# Patient Record
Sex: Male | Born: 1940 | Race: White | Hispanic: No | Marital: Married | Smoking: Never smoker
Health system: Southern US, Community
[De-identification: ages and names within clinical notes are randomized; demographics above are authoritative.]

## PROBLEM LIST (undated history)

## (undated) DIAGNOSIS — I2510 Atherosclerotic heart disease of native coronary artery without angina pectoris: Secondary | ICD-10-CM

## (undated) DIAGNOSIS — I10 Essential (primary) hypertension: Secondary | ICD-10-CM

## (undated) DIAGNOSIS — K219 Gastro-esophageal reflux disease without esophagitis: Secondary | ICD-10-CM

## (undated) DIAGNOSIS — N200 Calculus of kidney: Secondary | ICD-10-CM

## (undated) DIAGNOSIS — E7800 Pure hypercholesterolemia, unspecified: Secondary | ICD-10-CM

## (undated) DIAGNOSIS — R011 Cardiac murmur, unspecified: Secondary | ICD-10-CM

## (undated) HISTORY — PX: URETEROLITHOTOMY: SHX71

## (undated) HISTORY — PX: LITHOTRIPSY: SUR834

## (undated) HISTORY — PX: CATARACT EXTRACTION: SUR2

## (undated) HISTORY — PX: CORONARY ANGIOPLASTY WITH STENT PLACEMENT: SHX49

---

## 2010-11-04 ENCOUNTER — Emergency Department (HOSPITAL_COMMUNITY): Payer: Medicare Other

## 2010-11-04 ENCOUNTER — Emergency Department (HOSPITAL_COMMUNITY)
Admission: EM | Admit: 2010-11-04 | Discharge: 2010-11-04 | Disposition: A | Discharge status: Home / Self Care | Payer: Medicare Other | Attending: Emergency Medicine | Admitting: Emergency Medicine

## 2010-11-04 DIAGNOSIS — Z87442 Personal history of urinary calculi: Secondary | ICD-10-CM | POA: Insufficient documentation

## 2010-11-04 DIAGNOSIS — Z9861 Coronary angioplasty status: Secondary | ICD-10-CM | POA: Insufficient documentation

## 2010-11-04 DIAGNOSIS — I2510 Atherosclerotic heart disease of native coronary artery without angina pectoris: Secondary | ICD-10-CM | POA: Insufficient documentation

## 2010-11-04 DIAGNOSIS — I10 Essential (primary) hypertension: Secondary | ICD-10-CM | POA: Insufficient documentation

## 2010-11-04 DIAGNOSIS — R109 Unspecified abdominal pain: Secondary | ICD-10-CM | POA: Insufficient documentation

## 2010-11-04 DIAGNOSIS — R319 Hematuria, unspecified: Secondary | ICD-10-CM | POA: Insufficient documentation

## 2010-11-04 DIAGNOSIS — E7800 Pure hypercholesterolemia, unspecified: Secondary | ICD-10-CM | POA: Insufficient documentation

## 2010-11-04 HISTORY — DX: Pure hypercholesterolemia, unspecified: E78.00

## 2010-11-04 HISTORY — DX: Atherosclerotic heart disease of native coronary artery without angina pectoris: I25.10

## 2010-11-04 HISTORY — DX: Essential (primary) hypertension: I10

## 2010-11-04 HISTORY — DX: Calculus of kidney: N20.0

## 2010-11-04 LAB — BASIC METABOLIC PANEL
BUN: 15 mg/dL (ref 6–23)
CO2: 29 mEq/L (ref 19–32)
Calcium: 9.2 mg/dL (ref 8.4–10.5)
Creatinine, Ser: 0.92 mg/dL (ref 0.50–1.35)
Glucose, Bld: 98 mg/dL (ref 70–99)

## 2010-11-04 LAB — URINALYSIS, ROUTINE W REFLEX MICROSCOPIC
Bilirubin Urine: NEGATIVE
Ketones, ur: NEGATIVE mg/dL
Specific Gravity, Urine: 1.025 (ref 1.005–1.030)
Urobilinogen, UA: 0.2 mg/dL (ref 0.0–1.0)
pH: 8 (ref 5.0–8.0)

## 2010-11-04 LAB — CBC
HCT: 42 % (ref 39.0–52.0)
MCH: 31.2 pg (ref 26.0–34.0)
MCV: 90.9 fL (ref 78.0–100.0)
RBC: 4.62 MIL/uL (ref 4.22–5.81)
WBC: 7.4 10*3/uL (ref 4.0–10.5)

## 2010-11-04 LAB — DIFFERENTIAL
Eosinophils Absolute: 0 10*3/uL (ref 0.0–0.7)
Eosinophils Relative: 0 % (ref 0–5)
Lymphocytes Relative: 22 % (ref 12–46)
Lymphs Abs: 1.6 10*3/uL (ref 0.7–4.0)
Monocytes Absolute: 0.5 10*3/uL (ref 0.1–1.0)
Monocytes Relative: 7 % (ref 3–12)

## 2010-11-04 MED ORDER — HYDROCODONE-ACETAMINOPHEN 5-500 MG PO TABS
1.0000 | ORAL_TABLET | Freq: Four times a day (QID) | ORAL | Status: AC | PRN
Start: 2010-11-04 — End: 2010-12-15

## 2010-11-04 MED ORDER — CIPROFLOXACIN HCL 500 MG PO TABS: 500.0000 mg | ORAL_TABLET | Freq: Two times a day (BID) | ORAL | Status: AC

## 2010-11-04 NOTE — ED Provider Notes (Signed)
History   Scribed for Mario Pirro [name], MD, the patient was seen in room APFT22/APFT22. This chart was scribed by Clarita Crane. This patient's care was started at 3:52PM.  CSN: [id number] Arrival date & time: 11/04/2010  3:31 PM  Chief Complaint  Patient presents with  . Hematuria   HPI Mario Brooks is a 70 y.o. male who presents to the Emergency Department complaining of hematuria onset 5 hours ago and persistent since with associated mild nausea, urinary frequency and mild right flank pain. Patient describes right flank pain as dull and rates pain as a 1/10 at its worst. Patient also reports episode of dysuria several days ago which has not recurred since. Denies abdominal pain, fever, chills, chest pain, SOB. Patient states current flank pain is not similar to severity of pain experienced previously with a kidney stone. Patient with h/o CAD, hypertension, hyperlipidemia, kidney stones.   HPI ELEMENTS:  Onset: 5 hours ago Duration: persistent since onset  Timing: constant  Context:  as above  Associated symptoms: nausea, urinary frequency, right flank pain.  Denies abdominal pain, fever, chills, chest pain, SOB.  PAST MEDICAL HISTORY:  Past Medical History  Diagnosis Date  . Coronary artery disease   . Hypertension   . High cholesterol   . Kidney stones   . Kidney calculi     PAST SURGICAL HISTORY:  Past Surgical History  Procedure Date  . Coronary angioplasty with stent placement   . Cataract extraction   . Lithotripsy     MEDICATIONS:  Previous Medications   ATORVASTATIN (LIPITOR) 20 MG TABLET    Take 20 mg by mouth daily.     CO-ENZYME Q-10 30 MG CAPSULE    Take 30 mg by mouth daily.     DESLORATADINE (CLARINEX) 5 MG TABLET    Take 5 mg by mouth daily.     ESOMEPRAZOLE (NEXIUM) 40 MG CAPSULE    Take 40 mg by mouth daily before breakfast.     FISH OIL-OMEGA-3 FATTY ACIDS 1000 MG CAPSULE    Take 2 g by mouth daily.     FLUTICASONE (FLONASE) 50 MCG/ACT NASAL SPRAY     Place 2 sprays into the nose daily.     LISINOPRIL (PRINIVIL,ZESTRIL) 5 MG TABLET    Take 5 mg by mouth daily.     NAPHAZOLINE-PHENIRAMINE (VISINE-A OP)    Apply 2 drops to eye daily as needed.     OVER THE COUNTER MEDICATION    Take 1 tablet by mouth 2 (two) times daily. MDR: Multivitamin    TRETINOIN (RETIN-A) 0.025 % CREAM    Apply 1 application topically daily as needed.     VITAMIN E (VITAMIN E) 400 UNIT CAPSULE    Take 400 Units by mouth daily.       ALLERGIES:  Allergies as of 11/04/2010  . (No Known Allergies)     FAMILY HISTORY:  History reviewed. No pertinent family history.   SOCIAL HISTORY: History   Social History  . Marital Status: Married    Spouse Name: N/A    Number of Children: N/A  . Years of Education: N/A   Social History Main Topics  . Smoking status: Never Smoker   . Smokeless tobacco: None  . Alcohol Use: No  . Drug Use: No  . Sexually Active:    Other Topics Concern  . None   Social History Narrative  . None      Review of Systems 10 Systems reviewed and are negative  for acute change except as noted in the HPI.  Physical Exam  BP 144/87  Pulse 93  Temp(Src) 98.7 F (37.1 C) (Oral)  Resp 18  Ht 5\' 11"  (1.803 m)  Wt 185 lb (83.915 kg)  BMI 25.80 kg/m2  SpO2 99%  Physical Exam  Nursing note and vitals reviewed. Constitutional: He is oriented to person, place, and time. He appears well-developed and well-nourished. No distress.  HENT:  Head: Normocephalic and atraumatic.       Moist mucous membranes  Eyes: EOM are normal. Pupils are equal, round, and reactive to light.  Neck: Neck supple. No tracheal deviation present. No thyromegaly present.  Cardiovascular: Normal rate, regular rhythm and normal heart sounds.  Exam reveals no gallop and no friction rub.   No murmur heard. Pulmonary/Chest: Effort normal and breath sounds normal. He has no wheezes.  Abdominal: Soft. Bowel sounds are normal. He exhibits no distension. There is no  tenderness.  Musculoskeletal: Normal range of motion. He exhibits no edema.  Lymphadenopathy:    He has no cervical adenopathy.  Neurological: He is alert and oriented to person, place, and time. He has normal strength. No sensory deficit. GCS eye subscore is 4. GCS verbal subscore is 5. GCS motor subscore is 6.       L1 S5 intact. No sensory deficit.   Skin: Skin is warm and dry.  Psychiatric: He has a normal mood and affect. His behavior is normal.    ED Course  Procedures  OTHER DATA REVIEWED: Nursing notes, vital signs, and past medical records reviewed. Lab results reviewed and considered Imaging results reviewed and considered  DIAGNOSTIC STUDIES: Oxygen Saturation is 99% on room air, normal by my interpretation.    LABS / RADIOLOGY: Results for orders placed during the hospital encounter of 11/04/10  URINALYSIS, ROUTINE W REFLEX MICROSCOPIC      Component Value Range   Color, Urine BROWN (*) YELLOW    Appearance HAZY (*) CLEAR    Specific Gravity, Urine 1.025  1.005 - 1.030    pH 8.0  5.0 - 8.0    Glucose, UA NEGATIVE  NEGATIVE (mg/dL)   Hgb urine dipstick LARGE (*) NEGATIVE    Bilirubin Urine NEGATIVE  NEGATIVE    Ketones, ur NEGATIVE  NEGATIVE (mg/dL)   Protein, ur 30 (*) NEGATIVE (mg/dL)   Urobilinogen, UA 0.2  0.0 - 1.0 (mg/dL)   Nitrite NEGATIVE  NEGATIVE    Leukocytes, UA NEGATIVE  NEGATIVE   URINE MICROSCOPIC-ADD ON      Component Value Range   RBC / HPF TOO NUMEROUS TO COUNT  <3 (RBC/hpf)  CBC      Component Value Range   WBC 7.4  4.0 - 10.5 (K/uL)   RBC 4.62  4.22 - 5.81 (MIL/uL)   Hemoglobin 14.4  13.0 - 17.0 (g/dL)   HCT 16.1  09.6 - 04.5 (%)   MCV 90.9  78.0 - 100.0 (fL)   MCH 31.2  26.0 - 34.0 (pg)   MCHC 34.3  30.0 - 36.0 (g/dL)   RDW 40.9  81.1 - 91.4 (%)   Platelets 196  150 - 400 (K/uL)  DIFFERENTIAL      Component Value Range   Neutrophils Relative 71  43 - 77 (%)   Neutro Abs 5.3  1.7 - 7.7 (K/uL)   Lymphocytes Relative 22  12 - 46 (%)     Lymphs Abs 1.6  0.7 - 4.0 (K/uL)   Monocytes Relative 7  3 -  12 (%)   Monocytes Absolute 0.5  0.1 - 1.0 (K/uL)   Eosinophils Relative 0  0 - 5 (%)   Eosinophils Absolute 0.0  0.0 - 0.7 (K/uL)   Basophils Relative 0  0 - 1 (%)   Basophils Absolute 0.0  0.0 - 0.1 (K/uL)  BASIC METABOLIC PANEL      Component Value Range   Sodium 141  135 - 145 (mEq/L)   Potassium 3.9  3.5 - 5.1 (mEq/L)   Chloride 105  96 - 112 (mEq/L)   CO2 29  19 - 32 (mEq/L)   Glucose, Bld 98  70 - 99 (mg/dL)   BUN 15  6 - 23 (mg/dL)   Creatinine, Ser 1.61  0.50 - 1.35 (mg/dL)   Calcium 9.2  8.4 - 09.6 (mg/dL)   GFR calc non Af Amer >60  >60 (mL/min)   GFR calc Af Amer >60  >60 (mL/min)   Ct Abdomen Pelvis Wo Contrast  11/04/2010  *RADIOLOGY REPORT*  Clinical Data: Hematuria, nausea, urinary frequency, right flank pain, recent dysuria, history kidney stones, hypertension, coronary artery disease, hyperlipidemia  CT ABDOMEN AND PELVIS WITHOUT CONTRAST  Technique:  Multidetector CT imaging of the abdomen and pelvis was performed following the standard protocol without intravenous contrast. Sagittal and coronal MPR images reconstructed from axial data set.  Comparison: None  Findings: Lung bases clear. Large bladder calculus 1.8 x 1.6 x 1.1 cm. No additional urinary tract calcification or dilatation. Within limits of a nonenhanced exam, no focal abnormalities of the liver, spleen, pancreas, kidneys, or adrenal glands. Appendix not visualized, but no pericecal inflammatory process seen.  Numerous pelvic phleboliths. Dilatation of left inguinal ring and proximal inguinal canal by fat. Stomach and bowel loops grossly unremarkable for technique. No mass, adenopathy, free fluid, or inflammatory process. No acute osseous findings.  IMPRESSION: Large bladder calculus 1.8 x 1.6 x 1.1 cm. Otherwise negative noncontrast assessment of the urinary tract. Probable small left inguinal hernia. Nonvisualization of appendix, though no pericecal  inflammatory process identified.  Original Report Authenticated By: Lollie Marrow, M.D.    ED COURSE / COORDINATION OF CARE: Orders Placed This Encounter  Procedures  . Urine culture  . CT Abdomen Pelvis Wo Contrast  . Urinalysis with microscopic  . Urine microscopic-add on  . CBC  . Differential  . Basic metabolic panel  5:41PM- Patient informed of lab and imaging results. Explained probable cause of patient's symptoms and intent to d/c home. Also informed patient of referral to Dr. Jerre Simon in order to receive treatment for bladder stone. Patient agrees with plan set forth at this time.    MDM: Differential Diagnosis: Kidney stone cystitis   PLAN: review labs and CT scan The patient is to return the emergency department if there is any worsening of symptoms. I have reviewed the discharge instructions with the patient/family  CONDITION ON DISCHARGE: good   MEDICATIONS GIVEN IN THE E.D.  Medications  desloratadine (CLARINEX) 5 MG tablet (not administered)  esomeprazole (NEXIUM) 40 MG capsule (not administered)  atorvastatin (LIPITOR) 20 MG tablet (not administered)  lisinopril (PRINIVIL,ZESTRIL) 5 MG tablet (not administered)  tretinoin (RETIN-A) 0.025 % cream (not administered)  fluticasone (FLONASE) 50 MCG/ACT nasal spray (not administered)  Naphazoline-Pheniramine (VISINE-A OP) (not administered)  OVER THE COUNTER MEDICATION (not administered)  fish oil-omega-3 fatty acids 1000 MG capsule (not administered)  vitamin E (VITAMIN E) 400 UNIT capsule (not administered)  co-enzyme Q-10 30 MG capsule (not administered)      I  personally performed the services described in this documentation, which was scribed in my presence. The recorded information has been reviewed and considered. Cathrine Krizan [name], MD         Briggitte Boline [name], MD 11/04/10 [phone]

## 2010-11-04 NOTE — ED Notes (Signed)
Pt c/o blood in urine and burning upon urination

## 2010-11-05 LAB — URINE CULTURE

## 2010-11-09 ENCOUNTER — Ambulatory Visit (HOSPITAL_COMMUNITY)
Admission: RE | Admit: 2010-11-09 | Discharge: 2010-11-09 | Disposition: A | Discharge status: Home / Self Care | Payer: Medicare Other | Source: Ambulatory Visit | Attending: Urology | Admitting: Urology

## 2010-11-09 ENCOUNTER — Other Ambulatory Visit (HOSPITAL_COMMUNITY): Payer: Self-pay | Admitting: Urology

## 2010-11-09 DIAGNOSIS — N210 Calculus in bladder: Secondary | ICD-10-CM

## 2010-11-09 DIAGNOSIS — R109 Unspecified abdominal pain: Secondary | ICD-10-CM | POA: Insufficient documentation

## 2010-11-09 NOTE — Patient Instructions (Addendum)
20 Mario Brooks  11/09/2010   Your procedure is scheduled on:  11/15/2010  Report to [facility] at  800 AM.  Call this number if you have problems the morning of surgery: [phone]   Remember:   Do not eat food:After Midnight.  Do not drink clear liquids: After Midnight.  Take these medicines the morning of surgery with A SIP OF WATER: vicodin,clarinex,nexium,lisinopril,flomax   Do not wear jewelry, make-up or nail polish.  Do not wear lotions, powders, or perfumes. You may wear deodorant.  Do not shave 48 hours prior to surgery.  Do not bring valuables to the hospital.  Contacts, dentures or bridgework may not be worn into surgery.  Leave suitcase in the car. After surgery it may be brought to your room.  For patients admitted to the hospital, checkout time is 11:00 AM the day of discharge.   Patients discharged the day of surgery will not be allowed to drive home.  Name and phone number of your driver: family  Special Instructions: CHG Shower Use Special Wash: 1/2 bottle night before surgery and 1/2 bottle morning of surgery.   Please read over the following fact sheets that you were given: Pain Booklet, MRSA Information, Surgical Site Infection Prevention, Anesthesia Post-op Instructions and Care and Recovery After Surgery PATIENT INSTRUCTIONS POST-ANESTHESIA  IMMEDIATELY FOLLOWING SURGERY:  Do not drive or operate machinery for the first twenty four hours after surgery.  Do not make any important decisions for twenty four hours after surgery or while taking narcotic pain medications or sedatives.  If you develop intractable nausea and vomiting or a severe headache please notify your doctor immediately.  FOLLOW-UP:  Please make an appointment with your surgeon as instructed. You do not need to follow up with anesthesia unless specifically instructed to do so.  WOUND CARE INSTRUCTIONS (if applicable):  Keep a dry clean dressing on the anesthesia/puncture wound site if there is  drainage.  Once the wound has quit draining you may leave it open to air.  Generally you should leave the bandage intact for twenty four hours unless there is drainage.  If the epidural site drains for more than 36-48 hours please call the anesthesia department.  QUESTIONS?:  Please feel free to call your physician or the hospital operator if you have any questions, and they will be happy to assist you.     [facility] Anesthesia Department [address] [place] [phone]

## 2010-11-10 ENCOUNTER — Other Ambulatory Visit: Payer: Self-pay

## 2010-11-10 ENCOUNTER — Encounter (HOSPITAL_COMMUNITY): Payer: Self-pay

## 2010-11-10 ENCOUNTER — Encounter (HOSPITAL_COMMUNITY)
Admission: RE | Admit: 2010-11-10 | Discharge: 2010-11-10 | Disposition: A | Discharge status: Home / Self Care | Payer: Medicare Other | Source: Ambulatory Visit | Attending: Urology | Admitting: Urology

## 2010-11-10 HISTORY — DX: Gastro-esophageal reflux disease without esophagitis: K21.9

## 2010-11-10 HISTORY — DX: Cardiac murmur, unspecified: R01.1

## 2010-11-10 LAB — SURGICAL PCR SCREEN
MRSA, PCR: NEGATIVE
Staphylococcus aureus: NEGATIVE

## 2010-11-14 NOTE — H&P (Signed)
NAME:  URA, HAUSEN NO.:  [id number]  MEDICAL RECORD NO.:  [id number]  LOCATION:                                 FACILITY:  PHYSICIAN:  Dennie Maizes, M.D.   DATE OF BIRTH:  [birth year]  DATE OF ADMISSION: DATE OF DISCHARGE:  LH                             HISTORY & PHYSICAL   He is coming to the short-stay center on November 15, 2010.  CHIEF COMPLAINT:  Hematuria, suprapubic discomfort.  HISTORY OF PRESENT ILLNESS:  This 70 year old male has a history of recurrent urolithiasis in the past.  He has passed several stones since 1989.  He has undergone right ureteral lithotomy in [place] for right distal ureteral calculus.  He has passed 4 stones before.  While he was riding a lawnmower last week, he felt suprapubic discomfort and pain.  He noticed blood in the urine.  He went to the Emergency Room at [facility] for further evaluation on November 04, 2010. Microscopic hematuria was noted.  X-rays revealed large bladder calculus.  The patient was referred to me for further evaluation and management.  He has good urinary flow, urinary frequency x4, complains of nocturia x0 to 1.  AUA symptom score today is 6/35.  There is no past history of hematuria, urinary tract infections, or chronic prostatitis.  PAST MEDICAL HISTORY: 1. Recurrent urolithiasis status post right ureterolithotomy in 1989. 2. Coronary artery disease status post cardiac stent placement in     2010. 3. Status post right cataract extraction with lens implantation 2     months ago. 4. Elevated cholesterol. 5. GERD.  MEDICATIONS: 1. Lipitor 20 mg p.o. daily. 2. Nexium 40 mg p.o. daily. 3. Clarinex 5 mg p.o. daily. 4. Lisinopril 5 mg p.o. daily. 5. Flonase nasal spray.  ALLERGIES:  None.  REVIEW OF SYSTEMS:  Positive for hayfever, blurred vision, chest pain, high blood pressure, painful urination, and urinary frequency.  SOCIAL HISTORY:  The patient is married.  He is a  nonsmoker.  He denies use of alcohol.  PHYSICAL EXAMINATION:  VITAL SIGNS:  Height 5 feet and 11 inches, weight 185 pounds. HEAD, EYES, EARS, NOSE, AND THROAT:  Normal. LUNGS:  Clear to auscultation. HEART:  Regular rate and rhythm.  No murmurs. ABDOMEN:  Soft.  There is no palpable flank mass or CVA tenderness. Bladder is not distended. GU:  Penis and testes are normal. RECTAL:  Benign prostate.  The patient has undergone evaluation of a noncontrast CT scan of the abdomen pelvis at [facility].  CT scan revealed 1.8 x 1.6 x 1.1- cm size bladder calculus.  No upper tract urinary calculus or obstruction was noted.  X-ray of the KUB area revealed a 2.4 x 1.9-cm size bladder calculus.  IMPRESSION:  Large bladder calculus, hematuria, recurrent urolithiasis.  PLAN:  I have discussed with the patient regarding the management of the large bladder calculus.  He is scheduled to undergo a cystoscopy with holmium laser lithotripsy of the bladder calculus.  I explained to him regarding the diagnosis, operative details, alternative treatments, outcome, possible risks, and complications.  He has agreed for the procedure to be done.  He is advised to  stop taking aspirin for a week prior to the procedure.     Dennie Maizes, M.D.     SK/MEDQ  D:  11/14/2010  T:  11/14/2010  Job:  [id number]  cc:   Short-Stay Center at [facility]

## 2010-11-15 ENCOUNTER — Ambulatory Visit (HOSPITAL_COMMUNITY): Payer: Medicare Other | Admitting: Anesthesiology

## 2010-11-15 ENCOUNTER — Encounter (HOSPITAL_COMMUNITY): Payer: Self-pay | Admitting: Anesthesiology

## 2010-11-15 ENCOUNTER — Other Ambulatory Visit: Payer: Self-pay | Admitting: Urology

## 2010-11-15 ENCOUNTER — Encounter (HOSPITAL_COMMUNITY)
Admission: RE | Disposition: A | Discharge status: Home / Self Care | Payer: Self-pay | Source: Ambulatory Visit | Attending: Urology

## 2010-11-15 ENCOUNTER — Ambulatory Visit (HOSPITAL_COMMUNITY)
Admission: RE | Admit: 2010-11-15 | Discharge: 2010-11-15 | Disposition: A | Discharge status: Home / Self Care | Payer: Medicare Other | Source: Ambulatory Visit | Attending: Urology | Admitting: Urology

## 2010-11-15 DIAGNOSIS — R319 Hematuria, unspecified: Secondary | ICD-10-CM | POA: Insufficient documentation

## 2010-11-15 DIAGNOSIS — N210 Calculus in bladder: Secondary | ICD-10-CM | POA: Insufficient documentation

## 2010-11-15 DIAGNOSIS — Z01810 Encounter for preprocedural cardiovascular examination: Secondary | ICD-10-CM | POA: Insufficient documentation

## 2010-11-15 HISTORY — PX: CYSTOSCOPY: SHX5120

## 2010-11-15 SURGERY — CYSTOSCOPY
Anesthesia: Spinal | Wound class: Clean Contaminated

## 2010-11-15 MED ORDER — CIPROFLOXACIN IN D5W 400 MG/200ML IV SOLN: 400.0000 mg | INTRAVENOUS | Status: DC

## 2010-11-15 MED ORDER — PROPOFOL 10 MG/ML IV EMUL
INTRAVENOUS | Status: AC
  Filled 2010-11-15: qty 20

## 2010-11-15 MED ORDER — MIDAZOLAM HCL 2 MG/2ML IJ SOLN
1.0000 mg | INTRAMUSCULAR | Status: DC | PRN
  Administered 2010-11-15: 2 mg via INTRAVENOUS

## 2010-11-15 MED ORDER — LIDOCAINE HCL (PF) 1 % IJ SOLN
INTRAMUSCULAR | Status: AC
  Filled 2010-11-15: qty 5

## 2010-11-15 MED ORDER — ONDANSETRON HCL 4 MG/2ML IJ SOLN: 4.0000 mg | Freq: Once | INTRAMUSCULAR | Status: DC | PRN

## 2010-11-15 MED ORDER — LACTATED RINGERS IV SOLN: INTRAVENOUS | Status: DC

## 2010-11-15 MED ORDER — FENTANYL CITRATE 0.05 MG/ML IJ SOLN
INTRAMUSCULAR | Status: DC | PRN
  Administered 2010-11-15: 50 ug via INTRAVENOUS
  Administered 2010-11-15: 25 ug via INTRAVENOUS

## 2010-11-15 MED ORDER — CIPROFLOXACIN IN D5W 400 MG/200ML IV SOLN
INTRAVENOUS | Status: AC
  Administered 2010-11-15: 400 mg via INTRAVENOUS
  Filled 2010-11-15: qty 200

## 2010-11-15 MED ORDER — MIDAZOLAM HCL 2 MG/2ML IJ SOLN
INTRAMUSCULAR | Status: AC
  Filled 2010-11-15: qty 2

## 2010-11-15 MED ORDER — CIPROFLOXACIN HCL 500 MG PO TABS: 500.0000 mg | ORAL_TABLET | Freq: Two times a day (BID) | ORAL | Status: AC

## 2010-11-15 MED ORDER — STERILE WATER FOR IRRIGATION IR SOLN
Status: DC | PRN
  Administered 2010-11-15: 3000 mL

## 2010-11-15 MED ORDER — OXYCODONE-ACETAMINOPHEN 7.5-325 MG PO TABS: 1.0000 | ORAL_TABLET | ORAL | Status: AC | PRN

## 2010-11-15 MED ORDER — BUPIVACAINE IN DEXTROSE 0.75-8.25 % IT SOLN
INTRATHECAL | Status: AC
  Filled 2010-11-15: qty 2

## 2010-11-15 MED ORDER — FENTANYL CITRATE 0.05 MG/ML IJ SOLN: 25.0000 ug | INTRAMUSCULAR | Status: DC | PRN

## 2010-11-15 MED ORDER — FENTANYL CITRATE 0.05 MG/ML IJ SOLN
INTRAMUSCULAR | Status: DC | PRN
  Administered 2010-11-15: 25 ug via INTRATHECAL

## 2010-11-15 MED ORDER — FENTANYL CITRATE 0.05 MG/ML IJ SOLN
INTRAMUSCULAR | Status: AC
  Filled 2010-11-15: qty 2

## 2010-11-15 MED ORDER — PROPOFOL 10 MG/ML IV EMUL
INTRAVENOUS | Status: DC | PRN
  Administered 2010-11-15: 25 ug/kg/min via INTRAVENOUS

## 2010-11-15 MED ORDER — SODIUM CHLORIDE 0.9 % IR SOLN
Status: DC | PRN
  Administered 2010-11-15: 3000 mL

## 2010-11-15 MED ORDER — MIDAZOLAM HCL 5 MG/5ML IJ SOLN
INTRAMUSCULAR | Status: DC | PRN
  Administered 2010-11-15: 2 mg via INTRAVENOUS

## 2010-11-15 MED ORDER — BUPIVACAINE HCL 0.75 % IJ SOLN
INTRAMUSCULAR | Status: DC | PRN
  Administered 2010-11-15: 12 mg via INTRATHECAL

## 2010-11-15 MED ORDER — LACTATED RINGERS IV SOLN
INTRAVENOUS | Status: DC
  Administered 2010-11-15 (×2): via INTRAVENOUS

## 2010-11-15 MED ORDER — MIDAZOLAM HCL 2 MG/2ML IJ SOLN
INTRAMUSCULAR | Status: AC
  Administered 2010-11-15: 2 mg via INTRAVENOUS
  Filled 2010-11-15: qty 2

## 2010-11-15 SURGICAL SUPPLY — 22 items
BAG DRAIN URO TABLE W/ADPT NS (DRAPE) ×2 IMPLANT
BAG HAMPER (MISCELLANEOUS) ×2 IMPLANT
BAG URINE DRAINAGE (UROLOGICAL SUPPLIES) ×2 IMPLANT
CATH FOLEY 2WAY SLVR  5CC 18FR (CATHETERS) ×1
CATH FOLEY 2WAY SLVR 5CC 18FR (CATHETERS) ×1 IMPLANT
CLOTH BEACON ORANGE TIMEOUT ST (SAFETY) ×2 IMPLANT
GLOVE BIOGEL PI IND STRL 7.0 (GLOVE) ×2 IMPLANT
GLOVE BIOGEL PI INDICATOR 7.0 (GLOVE) ×2
GLOVE ECLIPSE 6.5 STRL STRAW (GLOVE) ×2 IMPLANT
GLOVE ECLIPSE 7.0 STRL STRAW (GLOVE) ×2 IMPLANT
GLOVE EXAM NITRILE MD LF STRL (GLOVE) ×2 IMPLANT
GOWN BRE IMP SLV AUR XL STRL (GOWN DISPOSABLE) ×6 IMPLANT
IV NS IRRIG 3000ML ARTHROMATIC (IV SOLUTION) ×4 IMPLANT
KIT ROOM TURNOVER AP CYSTO (KITS) ×2 IMPLANT
LASER FIBER DISP (UROLOGICAL SUPPLIES) IMPLANT
LASER FIBER DISP 1000U (UROLOGICAL SUPPLIES) ×2 IMPLANT
MANIFOLD NEPTUNE II (INSTRUMENTS) ×2 IMPLANT
PACK CYSTO (CUSTOM PROCEDURE TRAY) ×2 IMPLANT
PAD ARMBOARD 7.5X6 YLW CONV (MISCELLANEOUS) ×2 IMPLANT
SYRINGE IRR TOOMEY STRL 70CC (SYRINGE) IMPLANT
TOWEL OR 17X26 4PK STRL BLUE (TOWEL DISPOSABLE) ×2 IMPLANT
WATER STERILE IRR 1000ML POUR (IV SOLUTION) ×2 IMPLANT

## 2010-11-15 NOTE — Anesthesia Procedure Notes (Signed)
Spinal Block  Patient location during procedure: OR Start time: 11/15/2010 10:55 AM Staffing CRNA/Resident: Marylene Buerger Performed by: resident/CRNA  Preanesthetic Checklist Completed: patient identified, surgical consent, pre-op evaluation and monitors and equipment checked Spinal Block Patient position: right lateral decubitus Prep: Betadine Patient monitoring: heart rate, cardiac monitor, continuous pulse ox and blood pressure Approach: midline Location: L3-4 Injection technique: single-shot Needle Needle type: Spinocan  Needle gauge: 22 G Needle length: 9 cm Additional Notes [id number] 2012  10  mARCAINE 12 mG fENTANYL 25 mCG

## 2010-11-15 NOTE — Anesthesia Preprocedure Evaluation (Addendum)
Anesthesia Evaluation  Name, MR# and DOB Patient awake  General Assessment Comment  Reviewed: Allergy & Precautions, H&P , NPO status , Patient's Chart, lab work & pertinent test results  History of Anesthesia Complications Negative for: history of anesthetic complications  Airway Mallampati: III  Neck ROM: Full    Dental  (+) Teeth Intact   Pulmonary    pulmonary exam normalPulmonary Exam Normal     Cardiovascular hypertension, Pt. on medications - angina+ CAD Regular Normal    Neuro/Psych   GI/Hepatic/Renal   Endo/Other    Abdominal   Musculoskeletal   Hematology   Peds  Reproductive/Obstetrics    Anesthesia Other Findings             Anesthesia Physical Anesthesia Plan  ASA: III  Anesthesia Plan: Spinal   Post-op Pain Management:    Induction:   Airway Management Planned: Nasal Cannula  Additional Equipment:   Intra-op Plan:   Post-operative Plan:   Informed Consent: I have reviewed the patients History and Physical, chart, labs and discussed the procedure including the risks, benefits and alternatives for the proposed anesthesia with the patient or authorized representative who has indicated his/her understanding and acceptance.     Plan Discussed with: CRNA  Anesthesia Plan Comments: (Marcain SAB.)        Anesthesia Quick Evaluation

## 2010-11-15 NOTE — Preoperative (Signed)
Beta Blockers   Reason not to administer Beta Blockers:Not Applicable 

## 2010-11-15 NOTE — H&P (Signed)
Update H&P No changes in the H&P.

## 2010-11-15 NOTE — Transfer of Care (Signed)
Immediate Anesthesia Transfer of Care Note  Patient: Mario Brooks  Procedure(s) Performed:  CYSTOSCOPY - Cysto/Holmium Laser Lithotripsy Bladder Calculus-specimen given to patient per MD; HOLMIUM LASER APPLICATION  Patient Location: PACU  Anesthesia Type: Spinal  Level of Consciousness: awake, alert  and oriented  Airway & Oxygen Therapy: Patient Spontanous Breathing and Patient connected to face mask oxygen  Post-op Assessment: Report given to PACU RN, Post -op Vital signs reviewed and stable and Patient moving all extremities  Post vital signs: Reviewed and stable  Complications: No apparent anesthesia complications

## 2010-11-15 NOTE — Anesthesia Postprocedure Evaluation (Signed)
  Anesthesia Post-op Note  Patient: Mario Brooks  Procedure(s) Performed:  CYSTOSCOPY - Cysto/Holmium Laser Lithotripsy Bladder Calculus-specimen given to patient per MD; HOLMIUM LASER APPLICATION  Patient Location: PACU  Anesthesia Type: Spinal  Level of Consciousness: awake, alert  and oriented  Airway and Oxygen Therapy: Patient Spontanous Breathing  Post-op Pain: none  Post-op Assessment: Post-op Vital signs reviewed, Patient's Cardiovascular Status Stable and Respiratory Function Stable  Post-op Vital Signs: Reviewed and stable  Complications: No apparent anesthesia complications

## 2010-11-16 NOTE — Op Note (Signed)
NAMETORRIS, HOUSE               ACCOUNT NO.:  [id number]  MEDICAL RECORD NO.:  [id number]  LOCATION:  [facility]                          FACILITY:  [facility]  PHYSICIAN:  Dennie Maizes, M.D.   DATE OF BIRTH:  Jul 21, 1940  DATE OF PROCEDURE:  11/15/2010 DATE OF DISCHARGE:  11/15/2010                              OPERATIVE REPORT   PREOPERATIVE DIAGNOSES:  Hematuria, large bladder calculi size 2.5 cm.  POSTOPERATIVE DIAGNOSES:  Hematuria, large bladder calculi.size 2.5 cm.  OPERATIVE PROCEDURE:  Cystoscopy and holmium laser lithotripsy of large bladder calculus.  ANESTHESIA:  Spinal.  SURGEON:  Dennie Maizes, MD  COMPLICATIONS:  None.  ESTIMATED BLOOD LOSS:  Minimal.  DRAINS:  An 18-French Foley catheter in the bladder.  SPECIMEN:  Bladder stone fragments sent for chemical analysis.  COMPLICATIONS:  None.  INDICATIONS FOR PROCEDURE:  This 70 year old male was evaluated for intermittent gross hematuria.  His x-ray revealed a large bladder calculi measuring about 2.5 x 2 cm in size.  The patient was taken to operating room today for cystoscopy and holmium laser lithotripsy of the bladder calculus.  DESCRIPTION OF PROCEDURE:  Spinal anesthesia was induced, and the patient was placed on the table in the dorsal lithotomy position.  The lower abdomen and genitalia were prepped and draped in a sterile fashion.  Cystoscopy was done with a 25-French scope.  Urethra was normal.  There was moderate hypertrophy of the prostate with partial obstruction of bladder neck area.  The bladder was found to be trabeculated.  There was a large bladder stone about 2.5 cm in size inside the bladder lumen.  There is no other abnormality in the bladder.  Cystoscope was removed.  A 26-French Laserscope was inserted into the bladder.  The laser with a 1000 micron fiber was inserted into the bladder.  The stone was treated with holmium laser energy.  The procedure was started at 11:13 a.m. and ended  at 11:46 a.m. The pulse low at 10, high 20; power low 1, high 1.6, watts 32, pulses 20, total energy 13.56 kJ.  The stone fragmented well with the laser application. Stone fragments were then removed with an Solicitor.  There was no residual stone fragment in the bladder, and there was no injury to the bladder mucosa. There is no active bleeding at this time.  Instruments were removed.  An 18-French Foley catheter was inserted into the bladder and irrigation of bladder returned clear urine.  The patient was transferred to the PACU in a satisfactory condition.  Prior to transfer, an 18-French Foley catheter was inserted into the bladder.     Dennie Maizes, M.D.     SK/MEDQ  D:  11/15/2010  T:  11/15/2010  Job:  [id number]

## 2010-11-19 ENCOUNTER — Encounter (HOSPITAL_COMMUNITY): Payer: Self-pay | Admitting: Urology

## 2010-11-30 ENCOUNTER — Emergency Department (HOSPITAL_COMMUNITY): Payer: Medicare Other

## 2010-11-30 ENCOUNTER — Encounter (HOSPITAL_COMMUNITY): Payer: Self-pay

## 2010-11-30 ENCOUNTER — Inpatient Hospital Stay (HOSPITAL_COMMUNITY)
Admission: EM | Admit: 2010-11-30 | Discharge: 2010-12-03 | DRG: 373 | Disposition: A | Discharge status: Home / Self Care | Payer: Medicare Other | Attending: Internal Medicine | Admitting: Internal Medicine

## 2010-11-30 DIAGNOSIS — R197 Diarrhea, unspecified: Secondary | ICD-10-CM | POA: Diagnosis present

## 2010-11-30 DIAGNOSIS — I2510 Atherosclerotic heart disease of native coronary artery without angina pectoris: Secondary | ICD-10-CM | POA: Diagnosis present

## 2010-11-30 DIAGNOSIS — I10 Essential (primary) hypertension: Secondary | ICD-10-CM | POA: Diagnosis present

## 2010-11-30 DIAGNOSIS — D72829 Elevated white blood cell count, unspecified: Secondary | ICD-10-CM | POA: Diagnosis present

## 2010-11-30 DIAGNOSIS — R109 Unspecified abdominal pain: Secondary | ICD-10-CM | POA: Diagnosis present

## 2010-11-30 DIAGNOSIS — K529 Noninfective gastroenteritis and colitis, unspecified: Secondary | ICD-10-CM

## 2010-11-30 DIAGNOSIS — K219 Gastro-esophageal reflux disease without esophagitis: Secondary | ICD-10-CM | POA: Diagnosis present

## 2010-11-30 DIAGNOSIS — R112 Nausea with vomiting, unspecified: Secondary | ICD-10-CM | POA: Diagnosis present

## 2010-11-30 DIAGNOSIS — A0472 Enterocolitis due to Clostridium difficile, not specified as recurrent: Secondary | ICD-10-CM | POA: Diagnosis present

## 2010-11-30 DIAGNOSIS — E785 Hyperlipidemia, unspecified: Secondary | ICD-10-CM | POA: Diagnosis present

## 2010-11-30 DIAGNOSIS — Z9861 Coronary angioplasty status: Secondary | ICD-10-CM

## 2010-11-30 LAB — COMPREHENSIVE METABOLIC PANEL
AST: 26 U/L (ref 0–37)
Albumin: 3.6 g/dL (ref 3.5–5.2)
Alkaline Phosphatase: 95 U/L (ref 39–117)
Chloride: 104 mEq/L (ref 96–112)
Potassium: 3.8 mEq/L (ref 3.5–5.1)
Total Bilirubin: 1.2 mg/dL (ref 0.3–1.2)
Total Protein: 6.7 g/dL (ref 6.0–8.3)

## 2010-11-30 LAB — DIFFERENTIAL
Basophils Absolute: 0 10*3/uL (ref 0.0–0.1)
Basophils Relative: 0 % (ref 0–1)
Eosinophils Absolute: 0 10*3/uL (ref 0.0–0.7)
Neutro Abs: 18.1 10*3/uL — ABNORMAL HIGH (ref 1.7–7.7)
Neutrophils Relative %: 87 % — ABNORMAL HIGH (ref 43–77)

## 2010-11-30 LAB — CBC
MCHC: 34 g/dL (ref 30.0–36.0)
Platelets: 195 10*3/uL (ref 150–400)
RDW: 12.9 % (ref 11.5–15.5)

## 2010-11-30 LAB — URINALYSIS, ROUTINE W REFLEX MICROSCOPIC
Glucose, UA: NEGATIVE mg/dL
Ketones, ur: >80 mg/dL — AB
Leukocytes, UA: NEGATIVE
Specific Gravity, Urine: >1.03 — ABNORMAL HIGH (ref 1.005–1.030)
pH: 5.5 (ref 5.0–8.0)

## 2010-11-30 LAB — URINE MICROSCOPIC-ADD ON

## 2010-11-30 MED ORDER — ACETAMINOPHEN 500 MG PO TABS
1000.0000 mg | ORAL_TABLET | Freq: Once | ORAL | Status: AC
  Administered 2010-11-30: 1000 mg via ORAL
  Filled 2010-11-30: qty 2

## 2010-11-30 MED ORDER — ONDANSETRON HCL 4 MG/2ML IJ SOLN
4.0000 mg | Freq: Once | INTRAMUSCULAR | Status: AC
  Administered 2010-11-30: 4 mg via INTRAVENOUS
  Filled 2010-11-30: qty 2

## 2010-11-30 MED ORDER — ONDANSETRON HCL 4 MG/2ML IJ SOLN
INTRAMUSCULAR | Status: AC
  Administered 2010-11-30: 4 mg via INTRAVENOUS
  Filled 2010-11-30: qty 2

## 2010-11-30 MED ORDER — IOHEXOL 300 MG/ML  SOLN
100.0000 mL | Freq: Once | INTRAMUSCULAR | Status: AC | PRN
  Administered 2010-11-30: 100 mL via INTRAVENOUS

## 2010-11-30 MED ORDER — METRONIDAZOLE IN NACL 5-0.79 MG/ML-% IV SOLN
500.0000 mg | Freq: Once | INTRAVENOUS | Status: AC
  Administered 2010-11-30: 500 mg via INTRAVENOUS
  Filled 2010-11-30: qty 100

## 2010-11-30 MED ORDER — SODIUM CHLORIDE 0.9 % IV SOLN
Freq: Once | INTRAVENOUS | Status: AC
  Administered 2010-11-30: 16:00:00 via INTRAVENOUS

## 2010-11-30 NOTE — ED Notes (Addendum)
Pt reports improvement in nausea, although does report some continued pain.  Pt's largest complaint at present time is a headache.  Pt's also has mild increase in temperature. EDP at bedside.

## 2010-11-30 NOTE — H&P (Signed)
Mario Brooks is an 70 y.o. male.   Chief Complaint: Not feeling well HPI: 70 year old male with H/O cad S/P angioplast in 2010, hyperlipdemia and HTN presents with nusea vomiting and diarrhea over the last 24 hrs with subjective feeling of fever and chills. Patient had recent cystoscopy with stone extraction and was on antibiotic two weeks ago followed by anothr course of antibiotics two days ago for sinusitis. In the ER patient was found to have diffuse colitis in CT abdomen and pelvis with marked leucocytosis. Patient is admitted for colitis probably from C Diff.  Past Medical History  Diagnosis Date  . Coronary artery disease   . Hypertension   . High cholesterol   . Kidney stones   . Kidney calculi   . Heart murmur   . GERD (gastroesophageal reflux disease)     Past Surgical History  Procedure Date  . Coronary angioplasty with stent placement   . Cataract extraction     right eye-dUKE  . Lithotripsy     1984  . Ureterolithotomy     OPEN REMOVAL OF KIDNEY STONE RIGHT KIDNEY  . Cystoscopy 11/15/2010    Procedure: CYSTOSCOPY;  Surgeon: Dennie Maizes;  Location: [facility];  Service: Urology;  Laterality: N/A;  Cysto/Holmium Laser Lithotripsy Bladder Calculus-specimen given to patient per MD    Family History  Problem Relation Age of Onset  . Anesthesia problems Neg Hx   . Hypotension Neg Hx   . Malignant hyperthermia Neg Hx   . Pseudochol deficiency Neg Hx    Social History:  reports that he has never smoked. He does not have any smokeless tobacco history on file. He reports that he does not drink alcohol or use illicit drugs.  Allergies:  Allergies  Allergen Reactions  . Codeine     Medications Prior to Admission  Medication Dose Route Frequency Provider Last Rate Last Dose  . 0.9 %  sodium chloride infusion   Intravenous Once Benny Lennert, MD 75 mL/hr at 11/30/10 1546    . acetaminophen (TYLENOL) tablet 1,000 mg  1,000 mg Oral Once Benny Lennert, MD   1,000 mg at  11/30/10 1957  . iohexol (OMNIPAQUE) 300 MG/ML injection 100 mL  100 mL Intravenous Once PRN Medication Radiologist   100 mL at 11/30/10 1811  . metroNIDAZOLE (FLAGYL) IVPB 500 mg  500 mg Intravenous Once Benny Lennert, MD 100 mL/hr at 11/30/10 2045 500 mg at 11/30/10 2045  . ondansetron (ZOFRAN) 4 MG/2ML injection        4 mg at 11/30/10 1758  . ondansetron (ZOFRAN) injection 4 mg  4 mg Intravenous Once Benny Lennert, MD   4 mg at 11/30/10 1546   Medications Prior to Admission  Medication Sig Dispense Refill  . atorvastatin (LIPITOR) 20 MG tablet Take 20 mg by mouth at bedtime.       Marland Kitchen co-enzyme Q-10 30 MG capsule Take 30 mg by mouth every morning.       . desloratadine (CLARINEX) 5 MG tablet Take 5 mg by mouth at bedtime.       Marland Kitchen esomeprazole (NEXIUM) 40 MG capsule Take 40 mg by mouth daily before breakfast.        . fish oil-omega-3 fatty acids 1000 MG capsule Take 1 g by mouth at bedtime.       . fluticasone (FLONASE) 50 MCG/ACT nasal spray Place 2 sprays into the nose daily.        Marland Kitchen lisinopril (PRINIVIL,ZESTRIL)  5 MG tablet Take 5 mg by mouth at bedtime.       . tretinoin (RETIN-A) 0.025 % cream Apply 1 application topically daily as needed. For rosacea and blemishes      . vitamin E (VITAMIN E) 400 UNIT capsule Take 400 Units by mouth daily.        Marland Kitchen HYDROcodone-acetaminophen (VICODIN) 5-500 MG per tablet Take 1 tablet by mouth every 6 (six) hours as needed for pain.  10 tablet  0  . Naphazoline-Pheniramine (VISINE-A OP) Apply 2 drops to eye daily as needed. For itchy, tired eye      . OVER THE COUNTER MEDICATION Take 1 tablet by mouth 2 (two) times daily. MDR: Multivitamin       . oxyCODONE-acetaminophen (PERCOCET) 7.5-325 MG per tablet Take 1 tablet by mouth every 4 (four) hours as needed for pain.  20 tablet  0    Results for orders placed during the hospital encounter of 11/30/10 (from the past 48 hour(s))  URINALYSIS, ROUTINE W REFLEX MICROSCOPIC     Status: Abnormal    Collection Time   11/30/10  1:45 PM      Component Value Range Comment   Color, Urine YELLOW  YELLOW     Appearance CLEAR  CLEAR     Specific Gravity, Urine >1.030 (*) 1.005 - 1.030     pH 5.5  5.0 - 8.0     Glucose, UA NEGATIVE  NEGATIVE (mg/dL)    Hgb urine dipstick TRACE (*) NEGATIVE     Bilirubin Urine SMALL (*) NEGATIVE     Ketones, ur >80 (*) NEGATIVE (mg/dL)    Protein, ur 30 (*) NEGATIVE (mg/dL)    Urobilinogen, UA 0.2  0.0 - 1.0 (mg/dL)    Nitrite NEGATIVE  NEGATIVE     Leukocytes, UA NEGATIVE  NEGATIVE    URINE MICROSCOPIC-ADD ON     Status: Normal   Collection Time   11/30/10  1:45 PM      Component Value Range Comment   Squamous Epithelial / LPF RARE  RARE     WBC, UA 0-2  <3 (WBC/hpf)    RBC / HPF 0-2  <3 (RBC/hpf)    Urine-Other MUCOUS PRESENT     CBC     Status: Abnormal   Collection Time   11/30/10  3:47 PM      Component Value Range Comment   WBC 20.9 (*) 4.0 - 10.5 (K/uL)    RBC 4.74  4.22 - 5.81 (MIL/uL)    Hemoglobin 14.6  13.0 - 17.0 (g/dL)    HCT 04.5  40.9 - 81.1 (%)    MCV 90.7  78.0 - 100.0 (fL)    MCH 30.8  26.0 - 34.0 (pg)    MCHC 34.0  30.0 - 36.0 (g/dL)    RDW 91.4  78.2 - 95.6 (%)    Platelets 195  150 - 400 (K/uL)   DIFFERENTIAL     Status: Abnormal   Collection Time   11/30/10  3:47 PM      Component Value Range Comment   Neutrophils Relative 87 (*) 43 - 77 (%)    Neutro Abs 18.1 (*) 1.7 - 7.7 (K/uL)    Lymphocytes Relative 4 (*) 12 - 46 (%)    Lymphs Abs 0.8  0.7 - 4.0 (K/uL)    Monocytes Relative 10  3 - 12 (%)    Monocytes Absolute 2.0 (*) 0.1 - 1.0 (K/uL)    Eosinophils Relative 0  0 -  5 (%)    Eosinophils Absolute 0.0  0.0 - 0.7 (K/uL)    Basophils Relative 0  0 - 1 (%)    Basophils Absolute 0.0  0.0 - 0.1 (K/uL)   COMPREHENSIVE METABOLIC PANEL     Status: Abnormal   Collection Time   11/30/10  3:47 PM      Component Value Range Comment   Sodium 138  135 - 145 (mEq/L)    Potassium 3.8  3.5 - 5.1 (mEq/L)    Chloride 104  96 - 112  (mEq/L)    CO2 24  19 - 32 (mEq/L)    Glucose, Bld 116 (*) 70 - 99 (mg/dL)    BUN 18  6 - 23 (mg/dL)    Creatinine, Ser 1.61  0.50 - 1.35 (mg/dL)    Calcium 9.3  8.4 - 10.5 (mg/dL)    Total Protein 6.7  6.0 - 8.3 (g/dL)    Albumin 3.6  3.5 - 5.2 (g/dL)    AST 26  0 - 37 (U/L)    ALT 30  0 - 53 (U/L)    Alkaline Phosphatase 95  39 - 117 (U/L)    Total Bilirubin 1.2  0.3 - 1.2 (mg/dL)    GFR calc non Af Amer >60  >60 (mL/min)    GFR calc Af Amer >60  >60 (mL/min)    Ct Abdomen Pelvis W Contrast  11/30/2010  *RADIOLOGY REPORT*  Clinical Data: Fever, nausea, vomiting, and diarrhea  CT ABDOMEN AND PELVIS WITH CONTRAST  Technique:  Multidetector CT imaging of the abdomen and pelvis was performed following the standard protocol during bolus administration of intravenous contrast.  Contrast: OMNIPAQUE IOHEXOL 300 MG/ML IV SOLN  Comparison: CT stone study 11/04/2010  Findings: Visualized lung bases are clear.  Visualized heart is within normal limits for size.  The liver, gallbladder, spleen, pancreas, and adrenal glands are within normal limits.  No stone is seen within either kidney on contrast enhanced imaging. There is one low density lesion in the upper pole of the left kidney that measures 7 mm and is consistent with a simple cyst. Approximately 1 cm simple cyst is present in the right kidney.  Two additional tiny low density lesions of right kidney not completely characterized, but likely cysts.  There is no hydronephrosis.  Both ureters are normal in caliber.  No ureteral stone is seen.  There is no bladder stone.  Bladder appears within normal limits for degree of distention, not currently very distended.  There are calcifications within the normal-sized prostate gland.  The colonic wall is mildly circumferentially thickened from the level of the cecum to the rectum.  Wall thickness appears increased compared to prior study; for example,  wall thickening of the cecum is best on image number 62,  with adjacent mesenteric stranding. Wall thickening at the level the hepatic flexure is seen best on image number 35, and sigmoid colon wall thickening is appreciated on image number 73, among others.  No evidence of bowel obstruction. A definite appendix is not seen.  Terminal ileum is unremarkable.  The remainder of the small bowel loops are normal in caliber and wall thickness.  The stomach is within normal limits.  Abdominal aorta is normal in caliber.  Preaortic retroperitoneal lymph node measuring 6 mm is stable.  No pathologically enlarged lymph nodes in the abdomen or pelvis.  There is no ascites, free air, or abscess. Vertebral bodies are normal in height and alignment.  No acute or  suspicious bony abnormality.  IMPRESSION:  1.  Mild circumferential wall thickening of the majority of the colon suggests colitis.  This could be secondary to infection or inflammatory bowel disease.  There is some stranding adjacent to the cecum.  No evidence of abscess or obstruction.  2.  No urinary tract stone disease is seen on this contrast- enhanced study.  Negative for urinary tract obstruction.  Original Report Authenticated By: Britta Mccreedy, M.D.   Dg Abd Acute W/chest  11/30/2010  *RADIOLOGY REPORT*  Clinical Data: Diarrhea.  ACUTE ABDOMEN SERIES (ABDOMEN 2 VIEW & CHEST 1 VIEW)  Comparison: 11/09/2010  Findings: Heart and mediastinal contours are within normal limits. No focal opacities or effusions.  No acute bony abnormality.  Moderate stool throughout the colon.  Calcified phleboliths in the anatomic pelvis.  No obstruction or free air.  No organomegaly.  No acute bony abnormality.  IMPRESSION: No obstruction or free air.  No active cardiopulmonary disease.  Original Report Authenticated By: Cyndie Chime, M.D.    Review of Systems  Constitutional: Positive for fever and chills.  Respiratory: Negative for cough and shortness of breath.   Cardiovascular: Negative for chest pain and palpitations.    Gastrointestinal: Positive for nausea, vomiting, abdominal pain and diarrhea.  Genitourinary: Negative for dysuria and urgency.  Neurological: Negative for dizziness, loss of consciousness and headaches.  Psychiatric/Behavioral: Negative for depression.    Blood pressure 115/80, pulse 97, temperature 99.1 F (37.3 C), temperature source Oral, resp. rate 20, height 5\' 11"  (1.803 m), weight 83.915 kg (185 lb), SpO2 98.00%. Physical Exam  Constitutional: He is oriented to person, place, and time. He appears well-developed and well-nourished.  HENT:  Head: Normocephalic and atraumatic.  Eyes: Pupils are equal, round, and reactive to light.  Neck: Normal range of motion. Neck supple.  Cardiovascular: Normal rate and regular rhythm.   Respiratory: Effort normal and breath sounds normal.  GI: Soft. Bowel sounds are normal. There is no tenderness. There is no rebound and no guarding.  Musculoskeletal: Normal range of motion.  Neurological: He is alert and oriented to person, place, and time. He has normal reflexes.  Skin: Skin is warm and dry.  Psychiatric: His behavior is normal.     Assessment/Plan 1) Colitis probably C Diff  2)Nausea and vomting and diarrhea from #1 reason 3)H/O CAD S/P angioplasty stent presently chest pain free. 4)H/O Hyperlipidemia. 5)HTN 6)Leucocytosis.  Plan: Will admit to [facility] Patients colitis and nausea and vomiting and diarrhea and marked leucocytosis is probably from C diff colitis given the recent use of antibiotics and CT showing diffuse colitis.Will get cultures keep patient on clear liquids and flagyl stool studeis and hydrate. Recheck labs in AM. Continue present home medications.   Eduard Clos. 11/30/2010, 10:09 PM

## 2010-11-30 NOTE — ED Notes (Signed)
Pt c/o abd pain nausea and diarrhea. Pt states he has had abd pain for a long time and feels like it has been made worse by recent changes in medication.

## 2010-11-30 NOTE — ED Notes (Signed)
Pt states he had laser surgery for kidney stone removal 3 weeks ago. Two days ago he started with abd pain/flank pain and fever. C/o n.v as well.

## 2010-11-30 NOTE — ED Provider Notes (Addendum)
History    Scribed for Benny Lennert, MD, the patient was seen in room [facility]12[facility]12. This chart was scribed by Katha Cabal. This patient's care was started at 3:20 PM.   CSN: [id number] Arrival date & time: 11/30/2010  2:57 PM  Chief Complaint  Patient presents with  . Fever  . Flank Pain  . Abdominal Pain    HPI  (Consider location/radiation/quality/duration/timing/severity/associated sxs/prior treatment)  HPI  Mario Brooks is a 70 y.o. male who presents to the Emergency Department complaining of bilateral flank pain and suprapubic abdominal pain with associated fever (101.6 F), diaphoresis, and N/V/D today.  Fever began 3 days ago with sore throat, nasal congestion and rhinorrhea.  Patient saw PCP yesterday and was told he had a sinus infection.  Patient took 2 Zithromax pills yesterday but none today.  Denies current sinus infection sx.  Patient states that he began having fever, nausea and vomiting at 6 AM, and diarrhea began at 11:30 AM.  N/V/D has resolved.  Bilateral flank pain persists.  Per wife:  Patient had "quarter sized" kidney stone crushed by laser 3 weeks ago.   PCP Maximiano Coss, MD, MD Dr. Patrici Ranks   PAST MEDICAL HISTORY:  Past Medical History  Diagnosis Date  . Coronary artery disease   . Hypertension   . High cholesterol   . Kidney stones   . Kidney calculi   . Heart murmur   . GERD (gastroesophageal reflux disease)     PAST SURGICAL HISTORY:  Past Surgical History  Procedure Date  . Coronary angioplasty with stent placement   . Cataract extraction     right eye-dUKE  . Lithotripsy     1984  . Ureterolithotomy     OPEN REMOVAL OF KIDNEY STONE RIGHT KIDNEY  . Cystoscopy 11/15/2010    Procedure: CYSTOSCOPY;  Surgeon: Dennie Maizes;  Location: [facility];  Service: Urology;  Laterality: N/A;  Cysto/Holmium Laser Lithotripsy Bladder Calculus-specimen given to patient per MD    FAMILY HISTORY:  Family History  Problem Relation Age of  Onset  . Anesthesia problems Neg Hx   . Hypotension Neg Hx   . Malignant hyperthermia Neg Hx   . Pseudochol deficiency Neg Hx      SOCIAL HISTORY: History   Social History  . Marital Status: Married    Spouse Name: N/A    Number of Children: N/A  . Years of Education: N/A   Social History Main Topics  . Smoking status: Never Smoker   . Smokeless tobacco: None  . Alcohol Use: No     OCCASSIONAL WINE  . Drug Use: No  . Sexually Active: Yes   Other Topics Concern  . None   Social History Narrative  . None      Review of Systems  Review of Systems  Constitutional: Positive for fever and chills. Negative for fatigue.  HENT: Negative for congestion, sore throat, rhinorrhea, sinus pressure and ear discharge.   Eyes: Negative for discharge.  Respiratory: Negative for cough.   Cardiovascular: Negative for chest pain.  Gastrointestinal: Positive for nausea, vomiting, abdominal pain and diarrhea.  Genitourinary: Positive for flank pain. Negative for frequency and hematuria.  Musculoskeletal: Negative for back pain.  Skin: Negative for rash.  Neurological: Negative for seizures and headaches.  Hematological: Negative.   Psychiatric/Behavioral: Negative for hallucinations.    Allergies  Codeine  Home Medications   Current Outpatient Rx  Name Route Sig Dispense Refill  . ACETAMINOPHEN 500 MG PO TABS Oral Take  500 mg by mouth as needed. For pain     . ASPIRIN EC 81 MG PO TBEC Oral Take 81 mg by mouth at bedtime.      . ATORVASTATIN CALCIUM 20 MG PO TABS Oral Take 20 mg by mouth at bedtime.     Marland Kitchen HYDROCOD POLST-CHLORPHEN POLST 10-8 MG/5ML PO LQCR Oral Take 5 mLs by mouth every 12 (twelve) hours as needed. For cough     . COENZYME Q10 30 MG PO CAPS Oral Take 30 mg by mouth every morning.     . DESLORATADINE 5 MG PO TABS Oral Take 5 mg by mouth at bedtime.     Marland Kitchen ESOMEPRAZOLE MAGNESIUM 40 MG PO CPDR Oral Take 40 mg by mouth daily before breakfast.      . OMEGA-3 FATTY  ACIDS 1000 MG PO CAPS Oral Take 1 g by mouth at bedtime.     Marland Kitchen FLUTICASONE PROPIONATE 50 MCG/ACT NA SUSP Nasal Place 2 sprays into the nose daily.      Marland Kitchen LISINOPRIL 5 MG PO TABS Oral Take 5 mg by mouth at bedtime.     Carma Leaven M PLUS PO TABS Oral Take 1 tablet by mouth 2 (two) times daily.      . TRETINOIN 0.025 % EX CREA Topical Apply 1 application topically daily as needed. For rosacea and blemishes    . VITAMIN E 400 UNITS PO CAPS Oral Take 400 Units by mouth daily.      Marland Kitchen HYDROCODONE-ACETAMINOPHEN 5-500 MG PO TABS Oral Take 1 tablet by mouth every 6 (six) hours as needed for pain. 10 tablet 0  . VISINE-A OP Ophthalmic Apply 2 drops to eye daily as needed. For itchy, tired eye    . OVER THE COUNTER MEDICATION Oral Take 1 tablet by mouth 2 (two) times daily. MDR: Multivitamin     . OXYCODONE-ACETAMINOPHEN 7.5-325 MG PO TABS Oral Take 1 tablet by mouth every 4 (four) hours as needed for pain. 20 tablet 0    Physical Exam    BP 115/80  Pulse 97  Temp(Src) 101.1 F (38.4 C) (Oral)  Resp 20  Ht 5\' 11"  (1.803 m)  Wt 185 lb (83.915 kg)  BMI 25.80 kg/m2  SpO2 98%  Physical Exam  Constitutional: He is oriented to person, place, and time. He appears well-developed.  HENT:  Head: Normocephalic and atraumatic.  Eyes: Conjunctivae and EOM are normal. No scleral icterus.  Neck: Neck supple. No thyromegaly present.  Cardiovascular: Normal rate and regular rhythm.  Exam reveals no gallop and no friction rub.   Pulmonary/Chest: No stridor. He has no wheezes. He has no rales. He exhibits no tenderness.  Abdominal: Soft. He exhibits no distension. There is tenderness. There is no rebound.       suprapubic tenderness   Musculoskeletal: Normal range of motion. He exhibits no edema.  Lymphadenopathy:    He has no cervical adenopathy.  Neurological: He is oriented to person, place, and time. Coordination normal.  Skin: No rash noted. No erythema.  Psychiatric: He has a normal mood and affect. His  behavior is normal.    ED Course  Procedures (including critical care time) OTHER DATA REVIEWED: Nursing notes, vital signs, and past medical records reviewed.   DIAGNOSTIC STUDIES: Oxygen Saturation is 100% on room air, normal by my interpretation.       LABS / RADIOLOGY: Results for orders placed during the hospital encounter of 11/30/10  URINALYSIS, ROUTINE W REFLEX MICROSCOPIC  Component Value Range   Color, Urine YELLOW  YELLOW    Appearance CLEAR  CLEAR    Specific Gravity, Urine >1.030 (*) 1.005 - 1.030    pH 5.5  5.0 - 8.0    Glucose, UA NEGATIVE  NEGATIVE (mg/dL)   Hgb urine dipstick TRACE (*) NEGATIVE    Bilirubin Urine SMALL (*) NEGATIVE    Ketones, ur >80 (*) NEGATIVE (mg/dL)   Protein, ur 30 (*) NEGATIVE (mg/dL)   Urobilinogen, UA 0.2  0.0 - 1.0 (mg/dL)   Nitrite NEGATIVE  NEGATIVE    Leukocytes, UA NEGATIVE  NEGATIVE   URINE MICROSCOPIC-ADD ON      Component Value Range   Squamous Epithelial / LPF RARE  RARE    WBC, UA 0-2  <3 (WBC/hpf)   RBC / HPF 0-2  <3 (RBC/hpf)   Urine-Other MUCOUS PRESENT    CBC      Component Value Range   WBC 20.9 (*) 4.0 - 10.5 (K/uL)   RBC 4.74  4.22 - 5.81 (MIL/uL)   Hemoglobin 14.6  13.0 - 17.0 (g/dL)   HCT 16.1  09.6 - 04.5 (%)   MCV 90.7  78.0 - 100.0 (fL)   MCH 30.8  26.0 - 34.0 (pg)   MCHC 34.0  30.0 - 36.0 (g/dL)   RDW 40.9  81.1 - 91.4 (%)   Platelets 195  150 - 400 (K/uL)  DIFFERENTIAL      Component Value Range   Neutrophils Relative 87 (*) 43 - 77 (%)   Neutro Abs 18.1 (*) 1.7 - 7.7 (K/uL)   Lymphocytes Relative 4 (*) 12 - 46 (%)   Lymphs Abs 0.8  0.7 - 4.0 (K/uL)   Monocytes Relative 10  3 - 12 (%)   Monocytes Absolute 2.0 (*) 0.1 - 1.0 (K/uL)   Eosinophils Relative 0  0 - 5 (%)   Eosinophils Absolute 0.0  0.0 - 0.7 (K/uL)   Basophils Relative 0  0 - 1 (%)   Basophils Absolute 0.0  0.0 - 0.1 (K/uL)  COMPREHENSIVE METABOLIC PANEL      Component Value Range   Sodium 138  135 - 145 (mEq/L)   Potassium  3.8  3.5 - 5.1 (mEq/L)   Chloride 104  96 - 112 (mEq/L)   CO2 24  19 - 32 (mEq/L)   Glucose, Bld 116 (*) 70 - 99 (mg/dL)   BUN 18  6 - 23 (mg/dL)   Creatinine, Ser 7.82  0.50 - 1.35 (mg/dL)   Calcium 9.3  8.4 - 95.6 (mg/dL)   Total Protein 6.7  6.0 - 8.3 (g/dL)   Albumin 3.6  3.5 - 5.2 (g/dL)   AST 26  0 - 37 (U/L)   ALT 30  0 - 53 (U/L)   Alkaline Phosphatase 95  39 - 117 (U/L)   Total Bilirubin 1.2  0.3 - 1.2 (mg/dL)   GFR calc non Af Amer >60  >60 (mL/min)   GFR calc Af Amer >60  >60 (mL/min)    Ct Abdomen Pelvis W Contrast  11/30/2010  *RADIOLOGY REPORT*  Clinical Data: Fever, nausea, vomiting, and diarrhea  CT ABDOMEN AND PELVIS WITH CONTRAST  Technique:  Multidetector CT imaging of the abdomen and pelvis was performed following the standard protocol during bolus administration of intravenous contrast.  Contrast: OMNIPAQUE IOHEXOL 300 MG/ML IV SOLN  Comparison: CT stone study 11/04/2010  Findings: Visualized lung bases are clear.  Visualized heart is within normal limits for size.  The  liver, gallbladder, spleen, pancreas, and adrenal glands are within normal limits.  No stone is seen within either kidney on contrast enhanced imaging. There is one low density lesion in the upper pole of the left kidney that measures 7 mm and is consistent with a simple cyst. Approximately 1 cm simple cyst is present in the right kidney.  Two additional tiny low density lesions of right kidney not completely characterized, but likely cysts.  There is no hydronephrosis.  Both ureters are normal in caliber.  No ureteral stone is seen.  There is no bladder stone.  Bladder appears within normal limits for degree of distention, not currently very distended.  There are calcifications within the normal-sized prostate gland.  The colonic wall is mildly circumferentially thickened from the level of the cecum to the rectum.  Wall thickness appears increased compared to prior study; for example,  wall thickening of  the cecum is best on image number 62, with adjacent mesenteric stranding. Wall thickening at the level the hepatic flexure is seen best on image number 35, and sigmoid colon wall thickening is appreciated on image number 73, among others.  No evidence of bowel obstruction. A definite appendix is not seen.  Terminal ileum is unremarkable.  The remainder of the small bowel loops are normal in caliber and wall thickness.  The stomach is within normal limits.  Abdominal aorta is normal in caliber.  Preaortic retroperitoneal lymph node measuring 6 mm is stable.  No pathologically enlarged lymph nodes in the abdomen or pelvis.  There is no ascites, free air, or abscess. Vertebral bodies are normal in height and alignment.  No acute or suspicious bony abnormality.  IMPRESSION:  1.  Mild circumferential wall thickening of the majority of the colon suggests colitis.  This could be secondary to infection or inflammatory bowel disease.  There is some stranding adjacent to the cecum.  No evidence of abscess or obstruction.  2.  No urinary tract stone disease is seen on this contrast- enhanced study.  Negative for urinary tract obstruction.  Original Report Authenticated By: Britta Mccreedy, M.D.   Dg Abd Acute W/chest  11/30/2010  *RADIOLOGY REPORT*  Clinical Data: Diarrhea.  ACUTE ABDOMEN SERIES (ABDOMEN 2 VIEW & CHEST 1 VIEW)  Comparison: 11/09/2010  Findings: Heart and mediastinal contours are within normal limits. No focal opacities or effusions.  No acute bony abnormality.  Moderate stool throughout the colon.  Calcified phleboliths in the anatomic pelvis.  No obstruction or free air.  No organomegaly.  No acute bony abnormality.  IMPRESSION: No obstruction or free air.  No active cardiopulmonary disease.  Original Report Authenticated By: Cyndie Chime, M.D.       ED COURSE / COORDINATION OF CARE: 3:30 PM  Physical exam complete.    7:59 PM  Discussed patient history and radiological findings with [facility].   Possible colitis.  8:04 PM  Plan to admit patient.     Orders Placed This Encounter  Procedures  . DG Abd Acute W/Chest  . CT Abdomen Pelvis W Contrast  . Urinalysis with microscopic  . Urine microscopic-add on  . CBC  . Differential  . Comprehensive metabolic panel  . Consult to internal medicine    MDM   IMPRESSION: Diagnoses that have been ruled out:  Diagnoses that are still under consideration:  Final diagnoses:     MEDICATIONS GIVEN IN THE E.D. Scheduled Meds:    . sodium chloride   Intravenous Once  . acetaminophen  1,000 mg Oral Once  . ondansetron      . ondansetron  4 mg Intravenous Once   Continuous Infusions:     DISCHARGE MEDICATIONS: New Prescriptions   No medications on file     The chart was scribed for me under my direct supervision.  I personally performed the history, physical, and medical decision making and all procedures in the evaluation of this patient.Benny Lennert, MD 11/30/10 2011  Benny Lennert, MD 11/30/10 2012

## 2010-11-30 NOTE — ED Notes (Signed)
Pt reports improvement in headache.  Temp improved.

## 2010-12-01 LAB — CBC
HCT: 38.9 % — ABNORMAL LOW (ref 39.0–52.0)
MCH: 30.9 pg (ref 26.0–34.0)
MCHC: 33.9 g/dL (ref 30.0–36.0)
MCV: 91.1 fL (ref 78.0–100.0)
Platelets: 184 10*3/uL (ref 150–400)
RDW: 13 % (ref 11.5–15.5)

## 2010-12-01 LAB — COMPREHENSIVE METABOLIC PANEL
AST: 18 U/L (ref 0–37)
Albumin: 3.1 g/dL — ABNORMAL LOW (ref 3.5–5.2)
BUN: 14 mg/dL (ref 6–23)
Calcium: 8.7 mg/dL (ref 8.4–10.5)
Creatinine, Ser: 1.06 mg/dL (ref 0.50–1.35)

## 2010-12-01 LAB — CLOSTRIDIUM DIFFICILE BY PCR: Toxigenic C. Difficile by PCR: POSITIVE — AB

## 2010-12-01 LAB — LACTIC ACID, PLASMA: Lactic Acid, Venous: 0.7 mmol/L (ref 0.5–2.2)

## 2010-12-01 MED ORDER — PNEUMOCOCCAL VAC POLYVALENT 25 MCG/0.5ML IJ INJ
0.5000 mL | INJECTION | INTRAMUSCULAR | Status: AC
  Administered 2010-12-02: 0.5 mL via INTRAMUSCULAR
  Filled 2010-12-01 (×2): qty 0.5

## 2010-12-01 MED ORDER — ASPIRIN EC 81 MG PO TBEC
81.0000 mg | DELAYED_RELEASE_TABLET | Freq: Every day | ORAL | Status: DC
  Administered 2010-12-01 – 2010-12-02 (×2): 81 mg via ORAL
  Filled 2010-12-01 (×2): qty 1

## 2010-12-01 MED ORDER — OMEGA-3 FATTY ACIDS 1000 MG PO CAPS
1.0000 g | ORAL_CAPSULE | Freq: Every day | ORAL | Status: DC
  Administered 2010-12-01: 1 g via ORAL
  Filled 2010-12-01 (×2): qty 1

## 2010-12-01 MED ORDER — ZOLPIDEM TARTRATE 5 MG PO TABS
5.0000 mg | ORAL_TABLET | Freq: Every evening | ORAL | Status: DC | PRN
  Administered 2010-12-02: 5 mg via ORAL
  Filled 2010-12-01: qty 1

## 2010-12-01 MED ORDER — LORATADINE 10 MG PO TABS
10.0000 mg | ORAL_TABLET | Freq: Every day | ORAL | Status: DC
  Administered 2010-12-01 – 2010-12-03 (×3): 10 mg via ORAL
  Filled 2010-12-01 (×3): qty 1

## 2010-12-01 MED ORDER — SIMVASTATIN 20 MG PO TABS
40.0000 mg | ORAL_TABLET | Freq: Every day | ORAL | Status: DC
  Administered 2010-12-01 – 2010-12-02 (×2): 40 mg via ORAL
  Filled 2010-12-01: qty 2
  Filled 2010-12-01 (×2): qty 1
  Filled 2010-12-01: qty 2

## 2010-12-01 MED ORDER — ACETAMINOPHEN 500 MG PO TABS
500.0000 mg | ORAL_TABLET | Freq: Four times a day (QID) | ORAL | Status: DC | PRN
Start: 2010-12-01 — End: 2010-12-02

## 2010-12-01 MED ORDER — LISINOPRIL 5 MG PO TABS
5.0000 mg | ORAL_TABLET | Freq: Every day | ORAL | Status: DC
  Administered 2010-12-01 – 2010-12-02 (×2): 5 mg via ORAL
  Filled 2010-12-01 (×2): qty 1

## 2010-12-01 MED ORDER — PANTOPRAZOLE SODIUM 40 MG PO TBEC
40.0000 mg | DELAYED_RELEASE_TABLET | Freq: Every day | ORAL | Status: DC
  Administered 2010-12-01 – 2010-12-02 (×2): 40 mg via ORAL
  Filled 2010-12-01 (×2): qty 1

## 2010-12-01 MED ORDER — TRETINOIN 0.025 % EX CREA
1.0000 "application " | TOPICAL_CREAM | Freq: Every day | CUTANEOUS | Status: DC | PRN
  Filled 2010-12-01: qty 1

## 2010-12-01 MED ORDER — ONDANSETRON HCL 4 MG PO TABS: 4.0000 mg | ORAL_TABLET | Freq: Four times a day (QID) | ORAL | Status: DC | PRN

## 2010-12-01 MED ORDER — METRONIDAZOLE IN NACL 5-0.79 MG/ML-% IV SOLN
500.0000 mg | Freq: Three times a day (TID) | INTRAVENOUS | Status: DC
  Administered 2010-12-01 – 2010-12-02 (×4): 500 mg via INTRAVENOUS
  Filled 2010-12-01 (×8): qty 100

## 2010-12-01 MED ORDER — INFLUENZA VIRUS VACC SPLIT PF IM SUSP
0.5000 mL | Freq: Once | INTRAMUSCULAR | Status: AC
  Administered 2010-12-02: 0.5 mL via INTRAMUSCULAR
  Filled 2010-12-01 (×2): qty 0.5

## 2010-12-01 MED ORDER — THERA M PLUS PO TABS
1.0000 | ORAL_TABLET | Freq: Two times a day (BID) | ORAL | Status: DC
  Administered 2010-12-01 – 2010-12-02 (×4): 1 via ORAL
  Filled 2010-12-01 (×4): qty 1

## 2010-12-01 MED ORDER — ACETAMINOPHEN 650 MG RE SUPP: 650.0000 mg | Freq: Four times a day (QID) | RECTAL | Status: DC | PRN

## 2010-12-01 MED ORDER — FLUTICASONE PROPIONATE 50 MCG/ACT NA SUSP
2.0000 | Freq: Every day | NASAL | Status: DC
  Administered 2010-12-01 – 2010-12-03 (×3): 2 via NASAL
  Filled 2010-12-01: qty 320000

## 2010-12-01 MED ORDER — SODIUM CHLORIDE 0.9 % IV SOLN
INTRAVENOUS | Status: DC
  Administered 2010-12-01 (×3): via INTRAVENOUS

## 2010-12-01 MED ORDER — METRONIDAZOLE IN NACL 5-0.79 MG/ML-% IV SOLN
INTRAVENOUS | Status: AC
  Filled 2010-12-01: qty 100

## 2010-12-01 MED ORDER — VITAMIN E 180 MG (400 UNIT) PO CAPS
400.0000 [IU] | ORAL_CAPSULE | Freq: Every day | ORAL | Status: DC
  Administered 2010-12-01 – 2010-12-02 (×2): 400 [IU] via ORAL
  Filled 2010-12-01 (×4): qty 1

## 2010-12-01 MED ORDER — LIDOCAINE HCL (PF) 1 % IJ SOLN
INTRAMUSCULAR | Status: AC
  Filled 2010-12-01: qty 5

## 2010-12-01 MED ORDER — NAPHAZOLINE-PHENIRAMINE 0.025-0.3 % OP SOLN
1.0000 [drp] | Freq: Four times a day (QID) | OPHTHALMIC | Status: DC | PRN
  Filled 2010-12-01: qty 15

## 2010-12-01 MED ORDER — ACETAMINOPHEN 325 MG PO TABS: 650.0000 mg | ORAL_TABLET | Freq: Four times a day (QID) | ORAL | Status: DC | PRN

## 2010-12-01 MED ORDER — ONDANSETRON HCL 4 MG/2ML IJ SOLN: 4.0000 mg | Freq: Four times a day (QID) | INTRAMUSCULAR | Status: DC | PRN

## 2010-12-01 NOTE — Progress Notes (Signed)
Subjective: Feels much better already.  cdiff is positive.  Less diarrhea.  Appetite returning.  Denies abd pain.   Physical Exam: Blood pressure 109/70, pulse 91, temperature 98.8 F (37.1 C), temperature source Oral, resp. rate 18, height 5\' 11"  (1.803 m), weight 82.2 kg (181 lb 3.5 oz), SpO2 97.00%. an alert and oriented no distress cords regular rate and rhythm without murmurs rubs or gallops chest clear to agitation bilaterally no wheezes rhonchi or rales abdomen is soft nontender nondistended positive bowel sounds no hepatosplenomegaly no rebound no guarding extremities no clubbing cyanosis or edema skin no rashes psych normal affect neurologic focal neurologic deficits   Investigations: Results for orders placed during the hospital encounter of 11/30/10 (from the past 48 hour(s))  URINALYSIS, ROUTINE W REFLEX MICROSCOPIC     Status: Abnormal   Collection Time   11/30/10  1:45 PM      Component Value Range Comment   Color, Urine YELLOW  YELLOW     Appearance CLEAR  CLEAR     Specific Gravity, Urine >1.030 (*) 1.005 - 1.030     pH 5.5  5.0 - 8.0     Glucose, UA NEGATIVE  NEGATIVE (mg/dL)    Hgb urine dipstick TRACE (*) NEGATIVE     Bilirubin Urine SMALL (*) NEGATIVE     Ketones, ur >80 (*) NEGATIVE (mg/dL)    Protein, ur 30 (*) NEGATIVE (mg/dL)    Urobilinogen, UA 0.2  0.0 - 1.0 (mg/dL)    Nitrite NEGATIVE  NEGATIVE     Leukocytes, UA NEGATIVE  NEGATIVE    URINE MICROSCOPIC-ADD ON     Status: Normal   Collection Time   11/30/10  1:45 PM      Component Value Range Comment   Squamous Epithelial / LPF RARE  RARE     WBC, UA 0-2  <3 (WBC/hpf)    RBC / HPF 0-2  <3 (RBC/hpf)    Urine-Other MUCOUS PRESENT     CBC     Status: Abnormal   Collection Time   11/30/10  3:47 PM      Component Value Range Comment   WBC 20.9 (*) 4.0 - 10.5 (K/uL)    RBC 4.74  4.22 - 5.81 (MIL/uL)    Hemoglobin 14.6  13.0 - 17.0 (g/dL)    HCT 16.1  09.6 - 04.5 (%)    MCV 90.7  78.0 - 100.0 (fL)    MCH  30.8  26.0 - 34.0 (pg)    MCHC 34.0  30.0 - 36.0 (g/dL)    RDW 40.9  81.1 - 91.4 (%)    Platelets 195  150 - 400 (K/uL)   DIFFERENTIAL     Status: Abnormal   Collection Time   11/30/10  3:47 PM      Component Value Range Comment   Neutrophils Relative 87 (*) 43 - 77 (%)    Neutro Abs 18.1 (*) 1.7 - 7.7 (K/uL)    Lymphocytes Relative 4 (*) 12 - 46 (%)    Lymphs Abs 0.8  0.7 - 4.0 (K/uL)    Monocytes Relative 10  3 - 12 (%)    Monocytes Absolute 2.0 (*) 0.1 - 1.0 (K/uL)    Eosinophils Relative 0  0 - 5 (%)    Eosinophils Absolute 0.0  0.0 - 0.7 (K/uL)    Basophils Relative 0  0 - 1 (%)    Basophils Absolute 0.0  0.0 - 0.1 (K/uL)   COMPREHENSIVE METABOLIC PANEL  Status: Abnormal   Collection Time   11/30/10  3:47 PM      Component Value Range Comment   Sodium 138  135 - 145 (mEq/L)    Potassium 3.8  3.5 - 5.1 (mEq/L)    Chloride 104  96 - 112 (mEq/L)    CO2 24  19 - 32 (mEq/L)    Glucose, Bld 116 (*) 70 - 99 (mg/dL)    BUN 18  6 - 23 (mg/dL)    Creatinine, Ser 0.45  0.50 - 1.35 (mg/dL)    Calcium 9.3  8.4 - 10.5 (mg/dL)    Total Protein 6.7  6.0 - 8.3 (g/dL)    Albumin 3.6  3.5 - 5.2 (g/dL)    AST 26  0 - 37 (U/L)    ALT 30  0 - 53 (U/L)    Alkaline Phosphatase 95  39 - 117 (U/L)    Total Bilirubin 1.2  0.3 - 1.2 (mg/dL)    GFR calc non Af Amer >60  >60 (mL/min)    GFR calc Af Amer >60  >60 (mL/min)   CULTURE, BLOOD (ROUTINE X 2)     Status: Normal (Preliminary result)   Collection Time   12/01/10 12:33 AM      Component Value Range Comment   Specimen Description BLOOD RIGHT AC      Special Requests BOTTLES DRAWN AEROBIC AND ANAEROBIC 6CC      Culture PENDING      Report Status PENDING     CULTURE, BLOOD (ROUTINE X 2)     Status: Normal (Preliminary result)   Collection Time   12/01/10 12:34 AM      Component Value Range Comment   Specimen Description BLOOD BLOOD RIGHT HAND      Special Requests BOTTLES DRAWN AEROBIC AND ANAEROBIC 6CC      Culture PENDING      Report  Status PENDING     CLOSTRIDIUM DIFFICILE BY PCR     Status: Abnormal   Collection Time   12/01/10  1:32 AM      Component Value Range Comment   C difficile by pcr POSITIVE (*) NEGATIVE    CBC     Status: Abnormal   Collection Time   12/01/10  5:14 AM      Component Value Range Comment   WBC 15.9 (*) 4.0 - 10.5 (K/uL)    RBC 4.27  4.22 - 5.81 (MIL/uL)    Hemoglobin 13.2  13.0 - 17.0 (g/dL)    HCT 40.9 (*) 81.1 - 52.0 (%)    MCV 91.1  78.0 - 100.0 (fL)    MCH 30.9  26.0 - 34.0 (pg)    MCHC 33.9  30.0 - 36.0 (g/dL)    RDW 91.4  78.2 - 95.6 (%)    Platelets 184  150 - 400 (K/uL)   COMPREHENSIVE METABOLIC PANEL     Status: Abnormal   Collection Time   12/01/10  5:14 AM      Component Value Range Comment   Sodium 138  135 - 145 (mEq/L)    Potassium 3.8  3.5 - 5.1 (mEq/L)    Chloride 105  96 - 112 (mEq/L)    CO2 26  19 - 32 (mEq/L)    Glucose, Bld 103 (*) 70 - 99 (mg/dL)    BUN 14  6 - 23 (mg/dL)    Creatinine, Ser 2.13  0.50 - 1.35 (mg/dL)    Calcium 8.7  8.4 - 10.5 (mg/dL)  Total Protein 5.9 (*) 6.0 - 8.3 (g/dL)    Albumin 3.1 (*) 3.5 - 5.2 (g/dL)    AST 18  0 - 37 (U/L)    ALT 23  0 - 53 (U/L)    Alkaline Phosphatase 99  39 - 117 (U/L)    Total Bilirubin 0.7  0.3 - 1.2 (mg/dL)    GFR calc non Af Amer >60  >60 (mL/min)    GFR calc Af Amer >60  >60 (mL/min)   LACTIC ACID, PLASMA     Status: Normal   Collection Time   12/01/10  5:15 AM      Component Value Range Comment   Lactic Acid, Venous 0.7  0.5 - 2.2 (mmol/L)    Recent Results (from the past 240 hour(s))  CULTURE, BLOOD (ROUTINE X 2)     Status: Normal (Preliminary result)   Collection Time   12/01/10 12:33 AM      Component Value Range Status Comment   Specimen Description BLOOD RIGHT AC   Final    Special Requests BOTTLES DRAWN AEROBIC AND ANAEROBIC 6CC   Final    Culture PENDING   Incomplete    Report Status PENDING   Incomplete   CULTURE, BLOOD (ROUTINE X 2)     Status: Normal (Preliminary result)   Collection  Time   12/01/10 12:34 AM      Component Value Range Status Comment   Specimen Description BLOOD BLOOD RIGHT HAND   Final    Special Requests BOTTLES DRAWN AEROBIC AND ANAEROBIC 6CC   Final    Culture PENDING   Incomplete    Report Status PENDING   Incomplete   CLOSTRIDIUM DIFFICILE BY PCR     Status: Abnormal   Collection Time   12/01/10  1:32 AM      Component Value Range Status Comment   C difficile by pcr POSITIVE (*) NEGATIVE  Final     Ct Abdomen Pelvis W Contrast  11/30/2010  *RADIOLOGY REPORT*  Clinical Data: Fever, nausea, vomiting, and diarrhea  CT ABDOMEN AND PELVIS WITH CONTRAST  Technique:  Multidetector CT imaging of the abdomen and pelvis was performed following the standard protocol during bolus administration of intravenous contrast.  Contrast: OMNIPAQUE IOHEXOL 300 MG/ML IV SOLN  Comparison: CT stone study 11/04/2010  Findings: Visualized lung bases are clear.  Visualized heart is within normal limits for size.  The liver, gallbladder, spleen, pancreas, and adrenal glands are within normal limits.  No stone is seen within either kidney on contrast enhanced imaging. There is one low density lesion in the upper pole of the left kidney that measures 7 mm and is consistent with a simple cyst. Approximately 1 cm simple cyst is present in the right kidney.  Two additional tiny low density lesions of right kidney not completely characterized, but likely cysts.  There is no hydronephrosis.  Both ureters are normal in caliber.  No ureteral stone is seen.  There is no bladder stone.  Bladder appears within normal limits for degree of distention, not currently very distended.  There are calcifications within the normal-sized prostate gland.  The colonic wall is mildly circumferentially thickened from the level of the cecum to the rectum.  Wall thickness appears increased compared to prior study; for example,  wall thickening of the cecum is best on image number 62, with adjacent mesenteric  stranding. Wall thickening at the level the hepatic flexure is seen best on image number 35, and sigmoid colon wall thickening  is appreciated on image number 73, among others.  No evidence of bowel obstruction. A definite appendix is not seen.  Terminal ileum is unremarkable.  The remainder of the small bowel loops are normal in caliber and wall thickness.  The stomach is within normal limits.  Abdominal aorta is normal in caliber.  Preaortic retroperitoneal lymph node measuring 6 mm is stable.  No pathologically enlarged lymph nodes in the abdomen or pelvis.  There is no ascites, free air, or abscess. Vertebral bodies are normal in height and alignment.  No acute or suspicious bony abnormality.  IMPRESSION:  1.  Mild circumferential wall thickening of the majority of the colon suggests colitis.  This could be secondary to infection or inflammatory bowel disease.  There is some stranding adjacent to the cecum.  No evidence of abscess or obstruction.  2.  No urinary tract stone disease is seen on this contrast- enhanced study.  Negative for urinary tract obstruction.  Original Report Authenticated By: Britta Mccreedy, M.D.   Dg Abd Acute W/chest  11/30/2010  *RADIOLOGY REPORT*  Clinical Data: Diarrhea.  ACUTE ABDOMEN SERIES (ABDOMEN 2 VIEW & CHEST 1 VIEW)  Comparison: 11/09/2010  Findings: Heart and mediastinal contours are within normal limits. No focal opacities or effusions.  No acute bony abnormality.  Moderate stool throughout the colon.  Calcified phleboliths in the anatomic pelvis.  No obstruction or free air.  No organomegaly.  No acute bony abnormality.  IMPRESSION: No obstruction or free air.  No active cardiopulmonary disease.  Original Report Authenticated By: Cyndie Chime, M.D.      Medications: I have reviewed the patient's current medications.  Impression: 70 year old male with acute C. difficile colitis Principal Problem:  *Diarrhea secondary to C. difficile colitis improving with  Flagyl Active Problems:  Nausea & vomiting  Leucocytosis  Abdominal pain   Plan: Continue Flagyl advance diet as tolerated. He's been around many family members over the last several days I have instructed him that if his wife or others develop any fever abdominal pain or diarrhea to seek medical attention as this is contagious. He understands that. I expect him to improve daily and he will likely be here for at least 48 hours until his cultures are negative and he is afebrile for at least 24 hours. He is already had significant improvement in his diarrhea with Flagyl.     LOS: 1 day   Anberlin Diez A 12/01/2010, 9:52 AM

## 2010-12-01 NOTE — Progress Notes (Signed)
CRITICAL VALUE ALERT  Critical value received:  Positive C-diff  Date of notification: 12/01/10  Time of notification:  0320  Critical value read back:yes  Nurse who received alert:  Jinny Sanders, RN  MD notified (1st page):  Dr. Toniann Fail  Time of first page:  0324  MD notified (2nd page):  Time of second page:  Responding MD:  Dr. Toniann Fail  Time MD responded:  [phone]  No new orders were given.

## 2010-12-02 DIAGNOSIS — I10 Essential (primary) hypertension: Secondary | ICD-10-CM | POA: Diagnosis present

## 2010-12-02 DIAGNOSIS — K219 Gastro-esophageal reflux disease without esophagitis: Secondary | ICD-10-CM | POA: Diagnosis present

## 2010-12-02 DIAGNOSIS — A0472 Enterocolitis due to Clostridium difficile, not specified as recurrent: Secondary | ICD-10-CM | POA: Diagnosis present

## 2010-12-02 DIAGNOSIS — I2510 Atherosclerotic heart disease of native coronary artery without angina pectoris: Secondary | ICD-10-CM | POA: Diagnosis present

## 2010-12-02 LAB — URINE CULTURE: Culture  Setup Time: 201209261519

## 2010-12-02 MED ORDER — METRONIDAZOLE 500 MG PO TABS
500.0000 mg | ORAL_TABLET | Freq: Three times a day (TID) | ORAL | Status: DC
  Administered 2010-12-02 – 2010-12-03 (×3): 500 mg via ORAL
  Filled 2010-12-02 (×3): qty 1

## 2010-12-02 MED ORDER — GUAIFENESIN 100 MG/5ML PO SOLN
5.0000 mL | ORAL | Status: DC | PRN
  Filled 2010-12-02: qty 5

## 2010-12-02 NOTE — Progress Notes (Signed)
Chart reviewed  Subjective: Requesting solid food. Stool more formed. Occasional nausea but improved. No pain. No bleeding. Has ambulated.  Objective: Vital signs in last 24 hours: Filed Vitals:   12/01/10 1437 12/01/10 1700 12/01/10 2157 12/02/10 0615  BP: 107/70  109/74 121/78  Pulse: 79  79 80  Temp: 98.2 F (36.8 C) 98.2 F (36.8 C) 97.9 F (36.6 C) 97.8 F (36.6 C)  TempSrc: Oral  Oral   Resp: 20  20 20   Height:      Weight:      SpO2: 98%  97% 98%   Weight change:   Intake/Output Summary (Last 24 hours) at 12/02/10 1316 Last data filed at 12/02/10 0933  Gross per 24 hour  Intake 3374.99 ml  Output    675 ml  Net 2699.99 ml   General: Nontoxic. Comfortable. Talking on the telephone. HEENT moist mucous membranes no thrush Lungs clear to auscultation bilaterally without wheezes rhonchi or rales Cardiovascular regular rate rhythm without murmurs gallops rubs Abdomen soft nontender nondistended normal bowel sounds Extremities no clubbing cyanosis or edema. Sequential compression devices are in place.  Lab Results: Basic Metabolic Panel:  Lab 12/01/10 0454 11/30/10 1547  NA 138 138  K 3.8 3.8  CL 105 104  CO2 26 24  GLUCOSE 103* 116*  BUN 14 18  CREATININE 1.06 1.09  CALCIUM 8.7 9.3  MG -- --  PHOS -- --   Liver Function Tests:  Lab 12/01/10 0514 11/30/10 1547  AST 18 26  ALT 23 30  ALKPHOS 99 95  BILITOT 0.7 1.2  PROT 5.9* 6.7  ALBUMIN 3.1* 3.6   No results found for this basename: LIPASE:2,AMYLASE:2 in the last 168 hours No results found for this basename: AMMONIA:2 in the last 168 hours CBC:  Lab 12/01/10 0514 11/30/10 1547  WBC 15.9* 20.9*  NEUTROABS -- 18.1*  HGB 13.2 14.6  HCT 38.9* 43.0  MCV 91.1 90.7  PLT 184 195   Cardiac Enzymes: No results found for this basename: CKTOTAL:3,CKMB:3,CKMBINDEX:3,TROPONINI:3 in the last 168 hours BNP: No results found for this basename: POCBNP:3 in the last 168 hours D-Dimer: No results found for  this basename: DDIMER:2 in the last 168 hours CBG: No results found for this basename: GLUCAP:6 in the last 168 hours Hemoglobin A1C: No results found for this basename: HGBA1C in the last 168 hours Fasting Lipid Panel: No results found for this basename: CHOL,HDL,LDLCALC,TRIG,CHOLHDL,LDLDIRECT in the last 098 hours Thyroid Function Tests: No results found for this basename: TSH,T4TOTAL,FREET4,T3FREE,THYROIDAB in the last 168 hours Anemia Panel: No results found for this basename: VITAMINB12,FOLATE,FERRITIN,TIBC,IRON,RETICCTPCT in the last 168 hours  Micro Results: Recent Results (from the past 240 hour(s))  CULTURE, BLOOD (ROUTINE X 2)     Status: Normal (Preliminary result)   Collection Time   12/01/10 12:33 AM      Component Value Range Status Comment   Specimen Description BLOOD RIGHT ANTECUBITAL   Final    Special Requests BOTTLES DRAWN AEROBIC AND ANAEROBIC 6CC   Final    Culture NO GROWTH 1 DAY   Final    Report Status PENDING   Incomplete   CULTURE, BLOOD (ROUTINE X 2)     Status: Normal (Preliminary result)   Collection Time   12/01/10 12:34 AM      Component Value Range Status Comment   Specimen Description BLOOD RIGHT HAND   Final    Special Requests BOTTLES DRAWN AEROBIC AND ANAEROBIC 6CC   Final    Culture NO  GROWTH 1 DAY   Final    Report Status PENDING   Incomplete   CLOSTRIDIUM DIFFICILE BY PCR     Status: Abnormal   Collection Time   12/01/10  1:32 AM      Component Value Range Status Comment   C difficile by pcr POSITIVE (*) NEGATIVE  Final    Studies/Results: Ct Abdomen Pelvis W Contrast  11/30/2010  *RADIOLOGY REPORT*  Clinical Data: Fever, nausea, vomiting, and diarrhea  CT ABDOMEN AND PELVIS WITH CONTRAST  Technique:  Multidetector CT imaging of the abdomen and pelvis was performed following the standard protocol during bolus administration of intravenous contrast.  Contrast: OMNIPAQUE IOHEXOL 300 MG/ML IV SOLN  Comparison: CT stone study 11/04/2010   Findings: Visualized lung bases are clear.  Visualized heart is within normal limits for size.  The liver, gallbladder, spleen, pancreas, and adrenal glands are within normal limits.  No stone is seen within either kidney on contrast enhanced imaging. There is one low density lesion in the upper pole of the left kidney that measures 7 mm and is consistent with a simple cyst. Approximately 1 cm simple cyst is present in the right kidney.  Two additional tiny low density lesions of right kidney not completely characterized, but likely cysts.  There is no hydronephrosis.  Both ureters are normal in caliber.  No ureteral stone is seen.  There is no bladder stone.  Bladder appears within normal limits for degree of distention, not currently very distended.  There are calcifications within the normal-sized prostate gland.  The colonic wall is mildly circumferentially thickened from the level of the cecum to the rectum.  Wall thickness appears increased compared to prior study; for example,  wall thickening of the cecum is best on image number 62, with adjacent mesenteric stranding. Wall thickening at the level the hepatic flexure is seen best on image number 35, and sigmoid colon wall thickening is appreciated on image number 73, among others.  No evidence of bowel obstruction. A definite appendix is not seen.  Terminal ileum is unremarkable.  The remainder of the small bowel loops are normal in caliber and wall thickness.  The stomach is within normal limits.  Abdominal aorta is normal in caliber.  Preaortic retroperitoneal lymph node measuring 6 mm is stable.  No pathologically enlarged lymph nodes in the abdomen or pelvis.  There is no ascites, free air, or abscess. Vertebral bodies are normal in height and alignment.  No acute or suspicious bony abnormality.  IMPRESSION:  1.  Mild circumferential wall thickening of the majority of the colon suggests colitis.  This could be secondary to infection or inflammatory bowel  disease.  There is some stranding adjacent to the cecum.  No evidence of abscess or obstruction.  2.  No urinary tract stone disease is seen on this contrast- enhanced study.  Negative for urinary tract obstruction.  Original Report Authenticated By: Britta Mccreedy, M.D.   Dg Abd Acute W/chest  11/30/2010  *RADIOLOGY REPORT*  Clinical Data: Diarrhea.  ACUTE ABDOMEN SERIES (ABDOMEN 2 VIEW & CHEST 1 VIEW)  Comparison: 11/09/2010  Findings: Heart and mediastinal contours are within normal limits. No focal opacities or effusions.  No acute bony abnormality.  Moderate stool throughout the colon.  Calcified phleboliths in the anatomic pelvis.  No obstruction or free air.  No organomegaly.  No acute bony abnormality.  IMPRESSION: No obstruction or free air.  No active cardiopulmonary disease.  Original Report Authenticated By: Cyndie Chime, M.D.  Medications: I have reviewed the patient's current medications. Scheduled Meds:   . aspirin EC  81 mg Oral QHS  . fluticasone  2 spray Each Nare Daily  . influenza  inactive virus vaccine  0.5 mL Intramuscular Once  . lidocaine      . lisinopril  5 mg Oral QHS  . loratadine  10 mg Oral Daily  . metroNIDAZOLE  500 mg Oral Q8H  . pneumococcal 23 valent vaccine  0.5 mL Intramuscular Tomorrow-1000  . simvastatin  40 mg Oral Daily  . DISCONTD: fish oil-omega-3 fatty acids  1 g Oral QHS  . DISCONTD: metronidazole  500 mg Intravenous Q8H  . DISCONTD: multivitamins ther. w/minerals  1 tablet Oral BID  . DISCONTD: pantoprazole  40 mg Oral Daily  . DISCONTD: vitamin E  400 Units Oral Daily   Continuous Infusions:   . DISCONTD: sodium chloride 100 mL/hr at 12/01/10 1802   PRN Meds:.acetaminophen, ondansetron (ZOFRAN) IV, ondansetron, zolpidem, DISCONTD: acetaminophen, DISCONTD: acetaminophen, DISCONTD: naphazoline-pheniramine, DISCONTD: tretinoin Assessment/Plan: Principal Problem:  *C. difficile colitis Active Problems:  Benign hypertension  CAD (coronary  artery disease)  GERD (gastroesophageal reflux disease)  The patient continues to improve. Will advance diet. Change to oral metronidazole. Discontinue IV fluids and telemetry. Home tomorrow if stable. Stop proton pump inhibitor due to Clostridium difficile infection.   LOS: 2 days   Bryonna Sundby L 12/02/2010, 1:16 PM

## 2010-12-03 LAB — CBC
HCT: 36.3 % — ABNORMAL LOW (ref 39.0–52.0)
Hemoglobin: 12.5 g/dL — ABNORMAL LOW (ref 13.0–17.0)
MCHC: 34.4 g/dL (ref 30.0–36.0)
RDW: 12.9 % (ref 11.5–15.5)
WBC: 7.6 10*3/uL (ref 4.0–10.5)

## 2010-12-03 LAB — BASIC METABOLIC PANEL
BUN: 11 mg/dL (ref 6–23)
Chloride: 108 mEq/L (ref 96–112)
GFR calc Af Amer: >60 mL/min (ref >60)
GFR calc non Af Amer: >60 mL/min (ref >60)
Glucose, Bld: 93 mg/dL (ref 70–99)
Potassium: 4.1 mEq/L (ref 3.5–5.1)
Sodium: 142 mEq/L (ref 135–145)

## 2010-12-03 MED ORDER — METRONIDAZOLE 500 MG PO TABS: 500.0000 mg | ORAL_TABLET | Freq: Three times a day (TID) | ORAL | Status: AC

## 2010-12-03 NOTE — Progress Notes (Signed)
Pt discharged home with wife.  Instructed to see PCP if symptoms return or worsen.. Instructed on how to take new antibiotic at home. Pt verbalizes understanding

## 2010-12-03 NOTE — Discharge Summary (Signed)
Physician Discharge Summary  Patient ID: Mario Brooks MRN: [id number] DOB/AGE: December 18, 1940 70 y.o.  Admit date: 11/30/2010 Discharge date: 12/03/2010  Discharge Diagnoses:  Principal Problem:  *C. difficile colitis Active Problems:  Benign hypertension  CAD (coronary artery disease)  GERD (gastroesophageal reflux disease)   Current Discharge Medication List    START taking these medications   Details  metroNIDAZOLE (FLAGYL) 500 MG tablet Take 1 tablet (500 mg total) by mouth every 8 (eight) hours. Qty: 30 tablet, Refills: 0      CONTINUE these medications which have NOT CHANGED   Details  acetaminophen (TYLENOL) 500 MG tablet Take 500 mg by mouth as needed. For pain     aspirin EC 81 MG tablet Take 81 mg by mouth at bedtime.      atorvastatin (LIPITOR) 20 MG tablet Take 20 mg by mouth at bedtime.     chlorpheniramine-HYDROcodone (TUSSIONEX) 10-8 MG/5ML LQCR Take 5 mLs by mouth every 12 (twelve) hours as needed. For cough     co-enzyme Q-10 30 MG capsule Take 30 mg by mouth every morning.     desloratadine (CLARINEX) 5 MG tablet Take 5 mg by mouth at bedtime.     fish oil-omega-3 fatty acids 1000 MG capsule Take 1 g by mouth at bedtime.     fluticasone (FLONASE) 50 MCG/ACT nasal spray Place 2 sprays into the nose daily.      lisinopril (PRINIVIL,ZESTRIL) 5 MG tablet Take 5 mg by mouth at bedtime.     Multiple Vitamins-Minerals (MULTIVITAMINS THER. W/MINERALS) TABS Take 1 tablet by mouth 2 (two) times daily.      tretinoin (RETIN-A) 0.025 % cream Apply 1 application topically daily as needed. For rosacea and blemishes    vitamin E (VITAMIN E) 400 UNIT capsule Take 400 Units by mouth daily.      HYDROcodone-acetaminophen (VICODIN) 5-500 MG per tablet Take 1 tablet by mouth every 6 (six) hours as needed for pain. Qty: 10 tablet, Refills: 0    Naphazoline-Pheniramine (VISINE-A OP) Apply 2 drops to eye daily as needed. For itchy, tired eye    OVER THE COUNTER  MEDICATION Take 1 tablet by mouth 2 (two) times daily. MDR: Multivitamin     oxyCODONE-acetaminophen (PERCOCET) 7.5-325 MG per tablet Take 1 tablet by mouth every 4 (four) hours as needed for pain. Qty: 20 tablet, Refills: 0      STOP taking these medications     esomeprazole (NEXIUM) 40 MG capsule         Discharge Orders    Future Orders Please Complete By Expires   Diet general      Increase activity slowly      Discharge instructions      Comments:   No alcohol while on metronidazole   Call MD for:      Scheduling Instructions:   Return of diarrhea, vomiting, high fevers.      Follow-up Information    Follow up with [facility] DAVID, MD. (If symptoms worsen)    Contact information:   [address] [place] [phone]          Disposition: Home or Self Care  Discharged Condition: Stable  Consults:   none  Labs:   Results for orders placed during the hospital encounter of 11/30/10 (from the past 48 hour(s))  CBC     Status: Abnormal   Collection Time   12/03/10  4:45 AM      Component Value Range Comment   WBC 7.6  4.0 - 10.5 (K/uL)    RBC 4.03 (*) 4.22 - 5.81 (MIL/uL)    Hemoglobin 12.5 (*) 13.0 - 17.0 (g/dL)    HCT 78.4 (*) 69.6 - 52.0 (%)    MCV 90.1  78.0 - 100.0 (fL)    MCH 31.0  26.0 - 34.0 (pg)    MCHC 34.4  30.0 - 36.0 (g/dL)    RDW 29.5  28.4 - 13.2 (%)    Platelets 191  150 - 400 (K/uL)   BASIC METABOLIC PANEL     Status: Normal   Collection Time   12/03/10  4:45 AM      Component Value Range Comment   Sodium 142  135 - 145 (mEq/L)    Potassium 4.1  3.5 - 5.1 (mEq/L)    Chloride 108  96 - 112 (mEq/L)    CO2 28  19 - 32 (mEq/L)    Glucose, Bld 93  70 - 99 (mg/dL)    BUN 11  6 - 23 (mg/dL)    Creatinine, Ser 4.40  0.50 - 1.35 (mg/dL)    Calcium 8.6  8.4 - 10.5 (mg/dL)    GFR calc non Af Amer >60  >60 (mL/min)    GFR calc Af Amer >60  >60 (mL/min)     Diagnostics:  Ct Abdomen Pelvis Wo Contrast  11/04/2010   *RADIOLOGY REPORT*  Clinical Data: Hematuria, nausea, urinary frequency, right flank pain, recent dysuria, history kidney stones, hypertension, coronary artery disease, hyperlipidemia  CT ABDOMEN AND PELVIS WITHOUT CONTRAST  Technique:  Multidetector CT imaging of the abdomen and pelvis was performed following the standard protocol without intravenous contrast. Sagittal and coronal MPR images reconstructed from axial data set.  Comparison: None  Findings: Lung bases clear. Large bladder calculus 1.8 x 1.6 x 1.1 cm. No additional urinary tract calcification or dilatation. Within limits of a nonenhanced exam, no focal abnormalities of the liver, spleen, pancreas, kidneys, or adrenal glands. Appendix not visualized, but no pericecal inflammatory process seen.  Numerous pelvic phleboliths. Dilatation of left inguinal ring and proximal inguinal canal by fat. Stomach and bowel loops grossly unremarkable for technique. No mass, adenopathy, free fluid, or inflammatory process. No acute osseous findings.  IMPRESSION: Large bladder calculus 1.8 x 1.6 x 1.1 cm. Otherwise negative noncontrast assessment of the urinary tract. Probable small left inguinal hernia. Nonvisualization of appendix, though no pericecal inflammatory process identified.  Original Report Authenticated By: Lollie Marrow, M.D.   Dg Abd 1 View  11/09/2010  *RADIOLOGY REPORT*  Clinical Data: Bladder calculus.  Lower abdominal discomfort for 1 week.  ABDOMEN - 1 VIEW  Comparison: 11/04/2010  Findings: The urinary bladder calculus measuring 2.4 x 1.9 cm is still present centrally in the anatomic pelvis.  Numerous vascular calcifications are also present in the pelvis.  No renal or ureteral calcification is identified.  Lower lumbar facet arthropathy noted.  IMPRESSION:  1.  Stable appearance of bladder calculus. 2.  Lower lumbar spondylosis L5-S1.  Original Report Authenticated By: Dellia Cloud, M.D.   Ct Abdomen Pelvis W Contrast  11/30/2010   *RADIOLOGY REPORT*  Clinical Data: Fever, nausea, vomiting, and diarrhea  CT ABDOMEN AND PELVIS WITH CONTRAST  Technique:  Multidetector CT imaging of the abdomen and pelvis was performed following the standard protocol during bolus administration of intravenous contrast.  Contrast: OMNIPAQUE IOHEXOL 300 MG/ML IV SOLN  Comparison: CT stone study 11/04/2010  Findings: Visualized lung bases are clear.  Visualized heart is within normal limits for  size.  The liver, gallbladder, spleen, pancreas, and adrenal glands are within normal limits.  No stone is seen within either kidney on contrast enhanced imaging. There is one low density lesion in the upper pole of the left kidney that measures 7 mm and is consistent with a simple cyst. Approximately 1 cm simple cyst is present in the right kidney.  Two additional tiny low density lesions of right kidney not completely characterized, but likely cysts.  There is no hydronephrosis.  Both ureters are normal in caliber.  No ureteral stone is seen.  There is no bladder stone.  Bladder appears within normal limits for degree of distention, not currently very distended.  There are calcifications within the normal-sized prostate gland.  The colonic wall is mildly circumferentially thickened from the level of the cecum to the rectum.  Wall thickness appears increased compared to prior study; for example,  wall thickening of the cecum is best on image number 62, with adjacent mesenteric stranding. Wall thickening at the level the hepatic flexure is seen best on image number 35, and sigmoid colon wall thickening is appreciated on image number 73, among others.  No evidence of bowel obstruction. A definite appendix is not seen.  Terminal ileum is unremarkable.  The remainder of the small bowel loops are normal in caliber and wall thickness.  The stomach is within normal limits.  Abdominal aorta is normal in caliber.  Preaortic retroperitoneal lymph node measuring 6 mm is stable.   No pathologically enlarged lymph nodes in the abdomen or pelvis.  There is no ascites, free air, or abscess. Vertebral bodies are normal in height and alignment.  No acute or suspicious bony abnormality.  IMPRESSION:  1.  Mild circumferential wall thickening of the majority of the colon suggests colitis.  This could be secondary to infection or inflammatory bowel disease.  There is some stranding adjacent to the cecum.  No evidence of abscess or obstruction.  2.  No urinary tract stone disease is seen on this contrast- enhanced study.  Negative for urinary tract obstruction.  Original Report Authenticated By: Britta Mccreedy, M.D.   Dg Abd Acute W/chest  11/30/2010  *RADIOLOGY REPORT*  Clinical Data: Diarrhea.  ACUTE ABDOMEN SERIES (ABDOMEN 2 VIEW & CHEST 1 VIEW)  Comparison: 11/09/2010  Findings: Heart and mediastinal contours are within normal limits. No focal opacities or effusions.  No acute bony abnormality.  Moderate stool throughout the colon.  Calcified phleboliths in the anatomic pelvis.  No obstruction or free air.  No organomegaly.  No acute bony abnormality.  IMPRESSION: No obstruction or free air.  No active cardiopulmonary disease.  Original Report Authenticated By: Cyndie Chime, M.D.   Hospital Course:  Please see H&P for complete admission details. Mr. Augello is a pleasant 70 year old white male who presents to the emergency room with diarrhea, vomiting, fevers and chills. Patient had been on antibiotics 2 weeks prior to admission and 2 days prior to admission. He had a low-grade fever on admission. He had a leukocytosis. His abdomen was soft and nontender. CT scan of the abdomen showed diffuse colitis which would be consistent with C. difficile colitis. He was started empirically on IV Flagyl. Stool for C. difficile PCR confirmed the diagnosis. His leukocytosis and symptoms improved quickly. By the time of discharge, he was tolerating a regular diet and his stools were much less frequent and  more formed. His proton pump inhibitor but has been stopped and I recommend holding this for 2 months, to  lessen the likelihood of relapse.  Discharge Exam: Blood pressure 119/76, pulse 76, temperature 99 F (37.2 C), temperature source Oral, resp. rate 20, height 5\' 11"  (1.803 m), weight 82.2 kg (181 lb 3.5 oz), SpO2 99.00%. Exam unchanged from 12/02/2010   Signed: Crista Curb L 12/03/2010, 9:57 AM

## 2010-12-06 LAB — CULTURE, BLOOD (ROUTINE X 2)
Culture: NO GROWTH
Culture: NO GROWTH

## 2011-09-27 ENCOUNTER — Other Ambulatory Visit (HOSPITAL_COMMUNITY): Payer: Self-pay | Admitting: Urology

## 2011-09-27 ENCOUNTER — Ambulatory Visit (HOSPITAL_COMMUNITY)
Admission: RE | Admit: 2011-09-27 | Discharge: 2011-09-27 | Disposition: A | Discharge status: Home / Self Care | Payer: Medicare Other | Source: Ambulatory Visit | Attending: Urology | Admitting: Urology

## 2011-09-27 DIAGNOSIS — N200 Calculus of kidney: Secondary | ICD-10-CM

## 2012-09-27 ENCOUNTER — Ambulatory Visit (HOSPITAL_COMMUNITY)
Admission: RE | Admit: 2012-09-27 | Discharge: 2012-09-27 | Disposition: A | Discharge status: Home / Self Care | Payer: Medicare Other | Source: Ambulatory Visit | Attending: Urology | Admitting: Urology

## 2012-09-27 ENCOUNTER — Other Ambulatory Visit (HOSPITAL_COMMUNITY): Payer: Self-pay | Admitting: Urology

## 2012-09-27 DIAGNOSIS — N210 Calculus in bladder: Secondary | ICD-10-CM | POA: Insufficient documentation

## 2012-09-27 DIAGNOSIS — Z09 Encounter for follow-up examination after completed treatment for conditions other than malignant neoplasm: Secondary | ICD-10-CM | POA: Insufficient documentation

## 2014-05-23 ENCOUNTER — Ambulatory Visit (INDEPENDENT_AMBULATORY_CARE_PROVIDER_SITE_OTHER): Payer: Medicare Other | Admitting: Urology

## 2014-05-23 DIAGNOSIS — N200 Calculus of kidney: Secondary | ICD-10-CM | POA: Diagnosis not present

## 2016-10-04 ENCOUNTER — Other Ambulatory Visit (HOSPITAL_COMMUNITY): Payer: Self-pay | Admitting: Pulmonary Disease

## 2016-10-04 DIAGNOSIS — R059 Cough, unspecified: Secondary | ICD-10-CM

## 2016-10-04 DIAGNOSIS — R05 Cough: Secondary | ICD-10-CM

## 2016-10-11 ENCOUNTER — Ambulatory Visit (HOSPITAL_COMMUNITY)
Admission: RE | Admit: 2016-10-11 | Discharge: 2016-10-11 | Disposition: A | Discharge status: Home / Self Care | Payer: Medicare Other | Source: Ambulatory Visit | Attending: Pulmonary Disease | Admitting: Pulmonary Disease

## 2016-10-11 DIAGNOSIS — R05 Cough: Secondary | ICD-10-CM | POA: Insufficient documentation

## 2016-10-11 DIAGNOSIS — R938 Abnormal findings on diagnostic imaging of other specified body structures: Secondary | ICD-10-CM | POA: Diagnosis not present

## 2016-10-11 DIAGNOSIS — R059 Cough, unspecified: Secondary | ICD-10-CM

## 2017-10-30 ENCOUNTER — Encounter (HOSPITAL_COMMUNITY): Payer: Self-pay | Admitting: Emergency Medicine

## 2017-10-30 ENCOUNTER — Other Ambulatory Visit: Payer: Self-pay

## 2017-10-30 ENCOUNTER — Emergency Department (HOSPITAL_COMMUNITY)
Admission: EM | Admit: 2017-10-30 | Discharge: 2017-10-31 | Disposition: A | Discharge status: Home / Self Care | Payer: Medicare Other | Attending: Emergency Medicine | Admitting: Emergency Medicine

## 2017-10-30 DIAGNOSIS — R109 Unspecified abdominal pain: Secondary | ICD-10-CM | POA: Insufficient documentation

## 2017-10-30 DIAGNOSIS — Z7982 Long term (current) use of aspirin: Secondary | ICD-10-CM | POA: Insufficient documentation

## 2017-10-30 DIAGNOSIS — Z79899 Other long term (current) drug therapy: Secondary | ICD-10-CM | POA: Diagnosis not present

## 2017-10-30 DIAGNOSIS — I10 Essential (primary) hypertension: Secondary | ICD-10-CM | POA: Diagnosis not present

## 2017-10-30 DIAGNOSIS — R112 Nausea with vomiting, unspecified: Secondary | ICD-10-CM | POA: Diagnosis present

## 2017-10-30 DIAGNOSIS — I2510 Atherosclerotic heart disease of native coronary artery without angina pectoris: Secondary | ICD-10-CM | POA: Insufficient documentation

## 2017-10-30 LAB — COMPREHENSIVE METABOLIC PANEL
ALK PHOS: 86 U/L (ref 38–126)
ALT: 22 U/L (ref 0–44)
AST: 25 U/L (ref 15–41)
Albumin: 4.5 g/dL (ref 3.5–5.0)
Anion gap: 10 (ref 5–15)
BUN: 16 mg/dL (ref 8–23)
CALCIUM: 9.3 mg/dL (ref 8.9–10.3)
CO2: 24 mmol/L (ref 22–32)
CREATININE: 1.28 mg/dL — AB (ref 0.61–1.24)
Chloride: 104 mmol/L (ref 98–111)
GFR calc non Af Amer: 53 mL/min — ABNORMAL LOW (ref >60)
GLUCOSE: 119 mg/dL — AB (ref 70–99)
Potassium: 3.6 mmol/L (ref 3.5–5.1)
Sodium: 138 mmol/L (ref 135–145)
Total Bilirubin: 1.5 mg/dL — ABNORMAL HIGH (ref 0.3–1.2)
Total Protein: 7.9 g/dL (ref 6.5–8.1)

## 2017-10-30 LAB — CBC
HCT: 46.6 % (ref 39.0–52.0)
Hemoglobin: 16.2 g/dL (ref 13.0–17.0)
MCH: 32 pg (ref 26.0–34.0)
MCHC: 34.8 g/dL (ref 30.0–36.0)
MCV: 92.1 fL (ref 78.0–100.0)
PLATELETS: 195 10*3/uL (ref 150–400)
RBC: 5.06 MIL/uL (ref 4.22–5.81)
RDW: 12.9 % (ref 11.5–15.5)
WBC: 9.1 10*3/uL (ref 4.0–10.5)

## 2017-10-30 LAB — LIPASE, BLOOD: Lipase: 35 U/L (ref 11–51)

## 2017-10-30 LAB — URINALYSIS, ROUTINE W REFLEX MICROSCOPIC
Bilirubin Urine: NEGATIVE
Glucose, UA: NEGATIVE mg/dL
HGB URINE DIPSTICK: NEGATIVE
KETONES UR: 80 mg/dL — AB
Leukocytes, UA: NEGATIVE
Nitrite: NEGATIVE
PROTEIN: NEGATIVE mg/dL
Specific Gravity, Urine: 1.025 (ref 1.005–1.030)
pH: 5 (ref 5.0–8.0)

## 2017-10-30 NOTE — ED Triage Notes (Signed)
Pt states he has been vomiting since Thursday. Pt states he was seen at his PCP for the same. Pt was Dx with UTI and was given an antibiotic. Pt states since he began taking the antibiotic he has been vomiting.

## 2017-10-30 NOTE — ED Notes (Signed)
Pt c/o n/v x 2 days with some abdominal pain associated with dry heaves; pt denies any diarrhea

## 2017-10-31 ENCOUNTER — Emergency Department (HOSPITAL_COMMUNITY): Payer: Medicare Other

## 2017-10-31 LAB — TROPONIN I
Troponin I: <0.03 ng/mL (ref <0.03)
Troponin I: <0.03 ng/mL (ref <0.03)

## 2017-10-31 LAB — I-STAT CG4 LACTIC ACID, ED: LACTIC ACID, VENOUS: 1.25 mmol/L (ref 0.5–1.9)

## 2017-10-31 MED ORDER — SODIUM CHLORIDE 0.9 % IV BOLUS
1000.0000 mL | Freq: Once | INTRAVENOUS | Status: AC
  Administered 2017-10-31: 1000 mL via INTRAVENOUS

## 2017-10-31 MED ORDER — IOPAMIDOL (ISOVUE-300) INJECTION 61%
100.0000 mL | Freq: Once | INTRAVENOUS | Status: AC | PRN
  Administered 2017-10-31: 100 mL via INTRAVENOUS

## 2017-10-31 MED ORDER — ONDANSETRON HCL 4 MG/2ML IJ SOLN
4.0000 mg | Freq: Once | INTRAMUSCULAR | Status: AC
  Administered 2017-10-31: 4 mg via INTRAVENOUS
  Filled 2017-10-31: qty 2

## 2017-10-31 MED ORDER — SODIUM CHLORIDE 0.9 % IV BOLUS
500.0000 mL | Freq: Once | INTRAVENOUS | Status: AC
  Administered 2017-10-31: 500 mL via INTRAVENOUS

## 2017-10-31 MED ORDER — ONDANSETRON 4 MG PO TBDP: 4.0000 mg | ORAL_TABLET | Freq: Three times a day (TID) | ORAL | 0 refills | Status: DC | PRN

## 2017-10-31 NOTE — Discharge Instructions (Addendum)
As we discussed, suspect this is an adverse reaction to the Bactrim you are taking.  You should stop this medication and tell your doctor.  Your work-up today is reassuring.  Take the nausea medication as prescribed as needed.  Return to the ED with worsening pain, vomiting, fever or any other concerns.

## 2017-10-31 NOTE — ED Provider Notes (Signed)
[facility] EMERGENCY DEPARTMENT Provider Note   CSN: [id number] Arrival date & time: 10/30/17  2154     History   Chief Complaint Chief Complaint  Patient presents with  . Emesis    HPI Mario Brooks is a 77 y.o. male.  Patient states he saw his PCP last week for poor appetite and was treated for a possible UTI based on white blood cells in his urine.  He was given Bactrim.  He states 1 day after starting this he developed nausea.  He has had 3 episodes of vomiting yesterday and 4 today.  He has not had any of the Bactrim since the morning of August 25.  He did take a half a tablet then.  He denies any diarrhea or fever.  He denies any abdominal pain, back pain or chest pain.  Denies any cough or sore throat.  Denies any focal weakness, numbness or tingling. His Dr. told him to stop the Bactrim which he did and was given Cipro which she has not yet started.  Initially thought that he may have a kidney stone because he is a dark urine but he denies having any abdominal pain or flank pain.  The history is provided by the patient.    Past Medical History:  Diagnosis Date  . Coronary artery disease   . GERD (gastroesophageal reflux disease)   . Heart murmur   . High cholesterol   . Hypertension   . Kidney calculi   . Kidney stones     Patient Active Problem List   Diagnosis Date Noted  . C. difficile colitis 12/02/2010  . CAD (coronary artery disease) 12/02/2010  . Benign hypertension 12/02/2010  . GERD (gastroesophageal reflux disease) 12/02/2010    Past Surgical History:  Procedure Laterality Date  . CATARACT EXTRACTION     right eye-dUKE  . CORONARY ANGIOPLASTY WITH STENT PLACEMENT    . CYSTOSCOPY  11/15/2010   Procedure: CYSTOSCOPY;  Surgeon: Dennie Maizes;  Location: [facility];  Service: Urology;  Laterality: N/A;  Cysto/Holmium Laser Lithotripsy Bladder Calculus-specimen given to patient per MD  . LITHOTRIPSY     1984  . URETEROLITHOTOMY     OPEN REMOVAL OF  KIDNEY STONE RIGHT KIDNEY        Home Medications    Prior to Admission medications   Medication Sig Start Date End Date Taking? Authorizing Provider  acetaminophen (TYLENOL) 500 MG tablet Take 500 mg by mouth as needed. For pain     [provider]  aspirin EC 81 MG tablet Take 81 mg by mouth at bedtime.      [provider]  atorvastatin (LIPITOR) 20 MG tablet Take 20 mg by mouth at bedtime.     [provider]  chlorpheniramine-HYDROcodone (TUSSIONEX) 10-8 MG/5ML LQCR Take 5 mLs by mouth every 12 (twelve) hours as needed. For cough     [provider]  co-enzyme Q-10 30 MG capsule Take 30 mg by mouth every morning.     [provider]  desloratadine (CLARINEX) 5 MG tablet Take 5 mg by mouth at bedtime.     [provider]  fish oil-omega-3 fatty acids 1000 MG capsule Take 1 g by mouth at bedtime.     [provider]  fluticasone (FLONASE) 50 MCG/ACT nasal spray Place 2 sprays into the nose daily.      [provider]  lisinopril (PRINIVIL,ZESTRIL) 5 MG tablet Take 5 mg by mouth at bedtime.  [provider]  Multiple Vitamins-Minerals (MULTIVITAMINS THER. W/MINERALS) TABS Take 1 tablet by mouth 2 (two) times daily.      [provider]  Naphazoline-Pheniramine (VISINE-A OP) Apply 2 drops to eye daily as needed. For itchy, tired eye    [provider]  OVER THE COUNTER MEDICATION Take 1 tablet by mouth 2 (two) times daily. MDR: Multivitamin     [provider]  tretinoin (RETIN-A) 0.025 % cream Apply 1 application topically daily as needed. For rosacea and blemishes    [provider]  vitamin E (VITAMIN E) 400 UNIT capsule Take 400 Units by mouth daily.      [provider]    Family History Family History  Problem Relation Age of Onset  . Anesthesia problems Neg Hx   . Hypotension Neg Hx   . Malignant hyperthermia Neg Hx   . Pseudochol deficiency Neg Hx       Social History Social History   Tobacco Use  . Smoking status: Never Smoker  . Smokeless tobacco: Never Used  Substance Use Topics  . Alcohol use: No    Comment: OCCASSIONAL WINE  . Drug use: No     Allergies   Codeine   Review of Systems Review of Systems  Constitutional: Positive for appetite change and fatigue.  HENT: Negative for congestion, postnasal drip and rhinorrhea.   Respiratory: Negative for cough, chest tightness and shortness of breath.   Cardiovascular: Negative for palpitations.  Gastrointestinal: Positive for abdominal pain, nausea and vomiting.  Genitourinary: Positive for dysuria. Negative for hematuria and urgency.  Musculoskeletal: Positive for arthralgias and myalgias. Negative for back pain.  Skin: Negative for rash.  Neurological: Negative for dizziness, weakness and headaches.   all other systems are negative except as noted in the HPI and PMH.    Physical Exam Updated Vital Signs BP (!) 162/102 (BP Location: Right Arm)   Pulse 98   Temp 97.8 F (36.6 C) (Oral)   Resp 20   SpO2 99%   Physical Exam  Constitutional: He is oriented to person, place, and time. He appears well-developed and well-nourished. No distress.  HENT:  Head: Normocephalic and atraumatic.  Mouth/Throat: Oropharynx is clear and moist. No oropharyngeal exudate.  Mildly dry mucous membranes  Eyes: Pupils are equal, round, and reactive to light. Conjunctivae and EOM are normal.  Neck: Normal range of motion. Neck supple.  No meningismus.  Cardiovascular: Normal rate, regular rhythm, normal heart sounds and intact distal pulses.  No murmur heard. Pulmonary/Chest: Effort normal and breath sounds normal. No respiratory distress.  Abdominal: Soft. There is no tenderness. There is no rebound and no guarding.  Musculoskeletal: Normal range of motion. He exhibits no edema or tenderness.  Neurological: He is alert and oriented to person, place, and time. No cranial nerve  deficit. He exhibits normal muscle tone. Coordination normal.  No ataxia on finger to nose bilaterally. No pronator drift. 5/5 strength throughout. CN 2-12 intact.Equal grip strength. Sensation intact.   Skin: Skin is warm.  Psychiatric: He has a normal mood and affect. His behavior is normal.  Nursing note and vitals reviewed.    ED Treatments / Results  Labs (all labs ordered are listed, but only abnormal results are displayed) Labs Reviewed  COMPREHENSIVE METABOLIC PANEL - Abnormal; Notable for the following components:      Result Value   Glucose, Bld 119 (*)    Creatinine, Ser 1.28 (*)    Total Bilirubin 1.5 (*)  GFR calc non Af Amer 53 (*)    All other components within normal limits  URINALYSIS, ROUTINE W REFLEX MICROSCOPIC - Abnormal; Notable for the following components:   Ketones, ur 80 (*)    All other components within normal limits  URINE CULTURE  LIPASE, BLOOD  CBC  TROPONIN I  TROPONIN I  I-STAT CG4 LACTIC ACID, ED  I-STAT CG4 LACTIC ACID, ED    EKG EKG Interpretation  Date/Time:  Tuesday October 31 2017 00:35:04 EDT Ventricular Rate:  73 PR Interval:    QRS Duration: 102 QT Interval:  402 QTC Calculation: 443 R Axis:   1 Text Interpretation:  Sinus rhythm RSR' in V1 or V2, right VCD or RVH Baseline wander in lead(s) II III aVF No significant change was found Confirmed by Glynn Octaveancour, Ritvik Mczeal [phone]([id number]) on 10/31/2017 12:52:31 AM   Radiology Dg Chest 2 View  Result Date: 10/31/2017 CLINICAL DATA:  77 year old male with nausea. EXAM: CHEST - 2 VIEW COMPARISON:  None. FINDINGS: The heart size and mediastinal contours are within normal limits. Both lungs are clear. The visualized skeletal structures are unremarkable. IMPRESSION: No active cardiopulmonary disease. Electronically Signed   By: Elgie CollardArash  Radparvar M.D.   On: 10/31/2017 01:51   Ct Abdomen Pelvis W Contrast  Result Date: 10/31/2017 CLINICAL DATA:  77 y/o  M; vomiting since Thursday. EXAM: CT ABDOMEN AND  PELVIS WITH CONTRAST TECHNIQUE: Multidetector CT imaging of the abdomen and pelvis was performed using the standard protocol following bolus administration of intravenous contrast. CONTRAST:  100mL ISOVUE-300 IOPAMIDOL (ISOVUE-300) INJECTION 61% COMPARISON:  11/30/2010 CT abdomen and pelvis. FINDINGS: Lower chest: No acute abnormality. Hepatobiliary: No focal liver abnormality is seen. No gallstones, gallbladder wall thickening, or biliary dilatation. Pancreas: Unremarkable. No pancreatic ductal dilatation or surrounding inflammatory changes. Spleen: Normal in size without focal abnormality. Adrenals/Urinary Tract: Multiple renal cysts measuring up to 8 mm in left interpolar kidney and 14 mm in right kidney lower pole. Normal adrenal glands. No hydronephrosis or urinary stone. Normal bladder. Stomach/Bowel: Stomach is within normal limits. Appendix appears normal. No evidence of bowel wall thickening, distention, or inflammatory changes. Vascular/Lymphatic: Aortic atherosclerosis. No enlarged abdominal or pelvic lymph nodes. Reproductive: Prostate enlargement with calcifications. Other: No abdominal wall hernia or abnormality. No abdominopelvic ascites. Musculoskeletal: Lumbar spondylosis greatest at the L4-5 and L5-S1 levels with prominent facet arthropathy. Stable bone island within the L5 vertebral body. IMPRESSION: 1. No acute process identified. 2. Prostate enlargement, multiple renal cysts, aortic atherosclerosis, lower lumbar spondylosis. Electronically Signed   By: Mitzi HansenLance  Furusawa-Stratton M.D.   On: 10/31/2017 02:05    Procedures Procedures (including critical care time)  Medications Ordered in ED Medications - No data to display   Initial Impression / Assessment and Plan / ED Course  I have reviewed the triage vital signs and the nursing notes.  Pertinent labs & imaging results that were available during my care of the patient were reviewed by me and considered in my medical decision making  (see chart for details).     Anorexia for several days with nausea and vomiting which patient attributes to Bactrim use.  Has not had a Bactrim tablet for the past 2 days.  Denies any abdominal pain or fever. No chest pain or SOB.  Urinalysis here shows ketones but does not appear to be infected. May be partially treated. Culture sent.  Labs are reassuring.  Patient will be hydrated and given symptom control.  Work-up reassuring.  Labs unremarkable.  Troponin negative x2.  No chest pain.  Abdomen CT shows no acute pathology.  Patient is tolerating p.o. IVF given. Low suspicion for ACS. No acute intraabdominal pathology seen on CT.  Suspect adverse reaction to Bactrim.  Advised to stop this medication and follow-up with his doctor.  Urine culture pending.  No evidence of UTI on urinalysis today. Antiemetics for home, PO hydration, return precautions discussed.   Final Clinical Impressions(s) / ED Diagnoses   Final diagnoses:  Non-intractable vomiting with nausea, unspecified vomiting type    ED Discharge Orders    None       Cheetara Hoge, Jeannett Senior, MD 10/31/17 [phone]

## 2017-10-31 NOTE — ED Notes (Signed)
Pt up to the bathroom and back to bed °

## 2017-11-01 LAB — URINE CULTURE: CULTURE: NO GROWTH

## 2020-07-31 IMAGING — CT CT ABD-PELV W/ CM
2 of 5 series · 16 of 46 positions shown, 18 images · IV contrast (iopamidol)
Comparison: 11/30/2010 CT abdomen and pelvis.

CLINICAL DATA: 76 y/o  M; vomiting since [REDACTED].

EXAM:
CT ABDOMEN AND PELVIS WITH CONTRAST
TECHNIQUE: Multidetector CT imaging of the abdomen and pelvis was performed
using the standard protocol following bolus administration of
intravenous contrast.
CONTRAST:  100mL 96IU92-1OO IOPAMIDOL (96IU92-1OO) INJECTION 61%

[Series 2: axial st · axial · 0.78mm/px · z∈[-588,-158]mm · 13 of 98 slices shown, 15 images]
[im 6/98  soft-tissue]
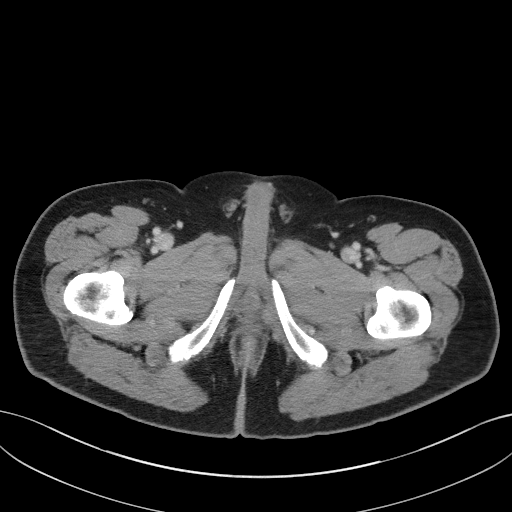
[im 6/98  bone]
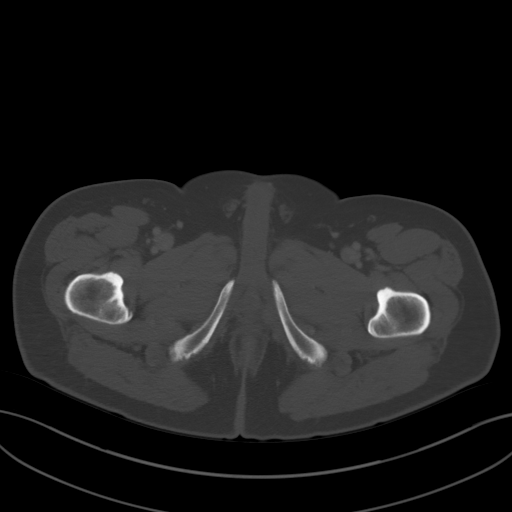
[im 12/98  soft-tissue]
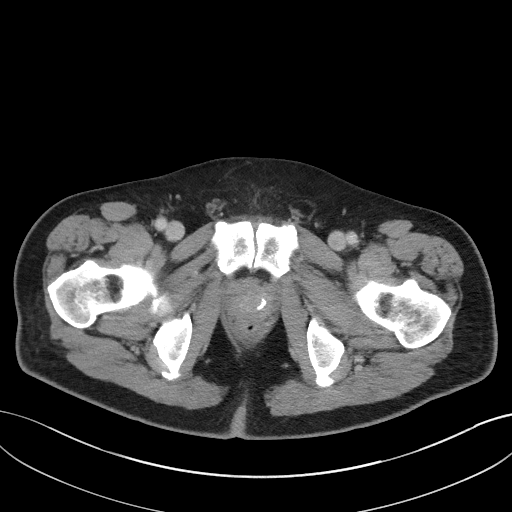
[im 23/98  soft-tissue]
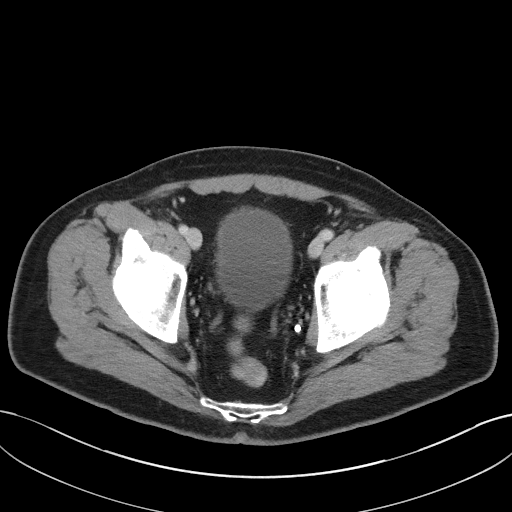
[im 29/98  soft-tissue]
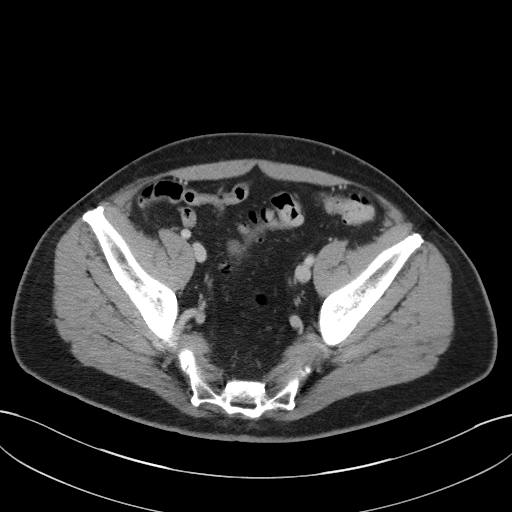
[im 35/98  soft-tissue]
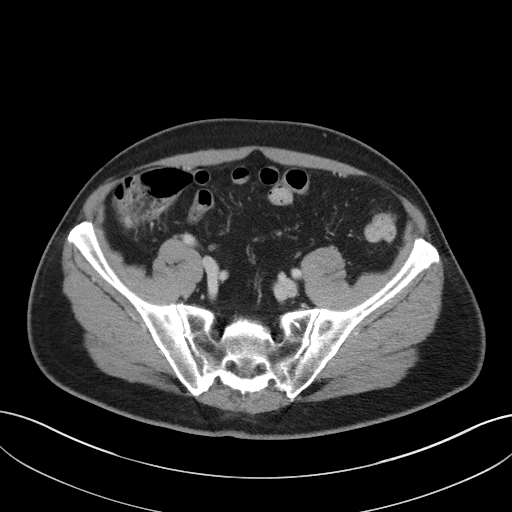
[im 40/98  soft-tissue]
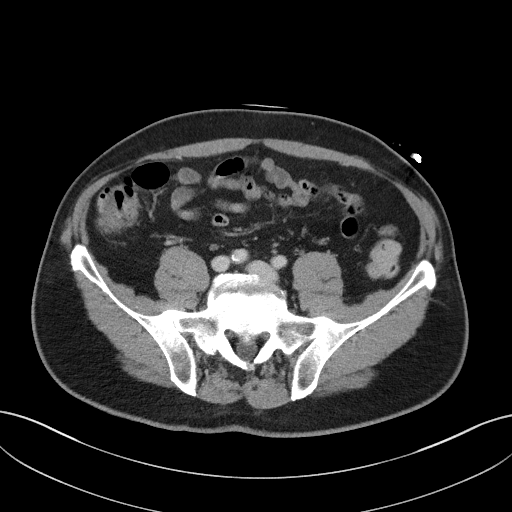
[im 52/98  soft-tissue]
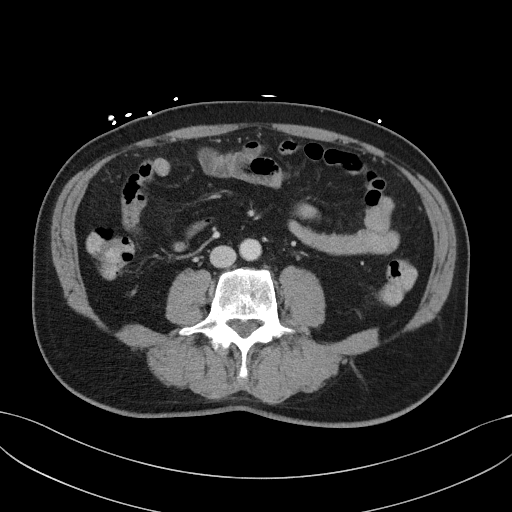
[im 58/98  soft-tissue]
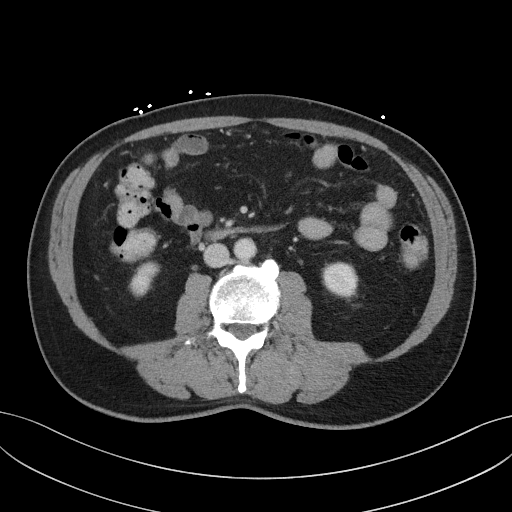
[im 63/98  soft-tissue]
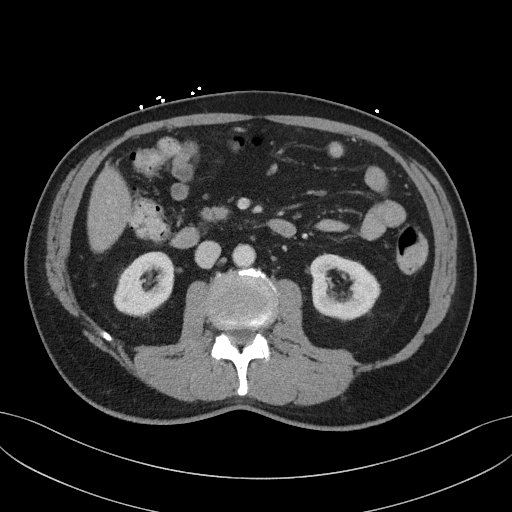
[im 63/98  bone]
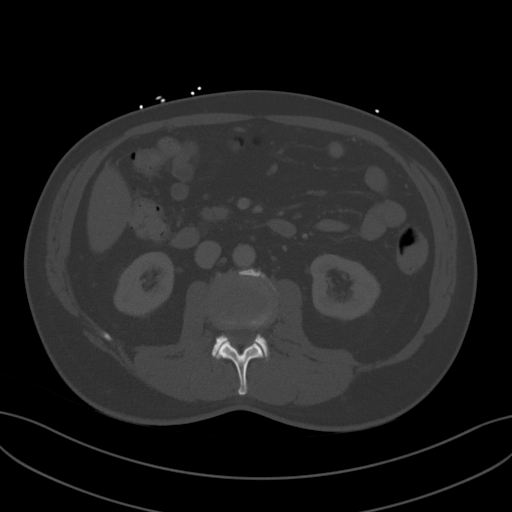
[im 69/98  soft-tissue]
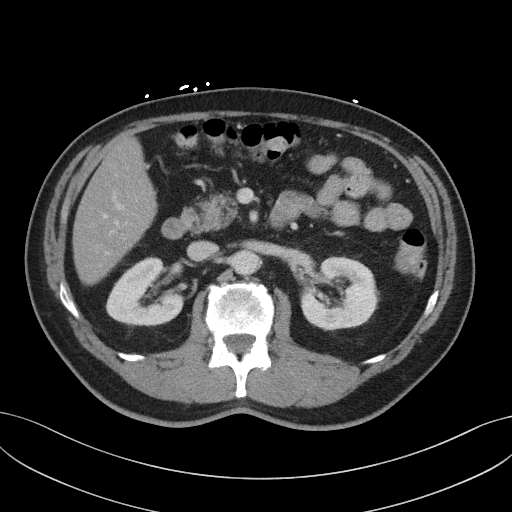
[im 75/98  soft-tissue]
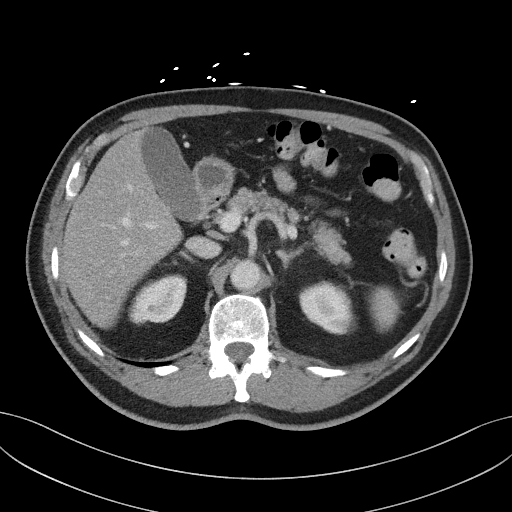
[im 86/98  soft-tissue]
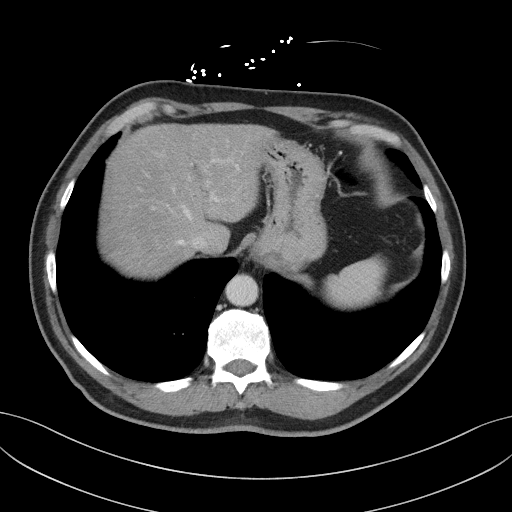
[im 92/98  soft-tissue]
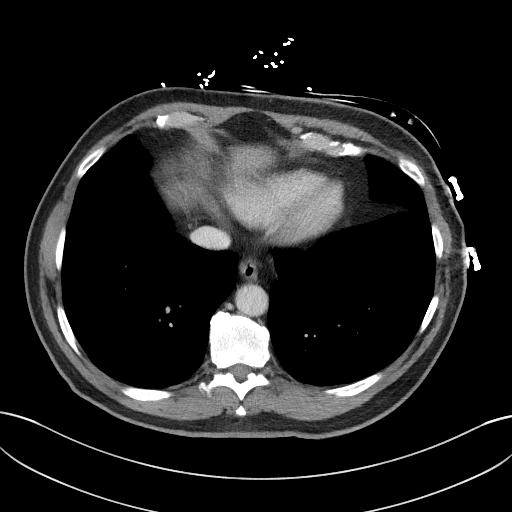

[Series 5: coronal st · coronal · 0.85mm/px · 3 of 85 slices shown]
[im 29/85  soft-tissue]
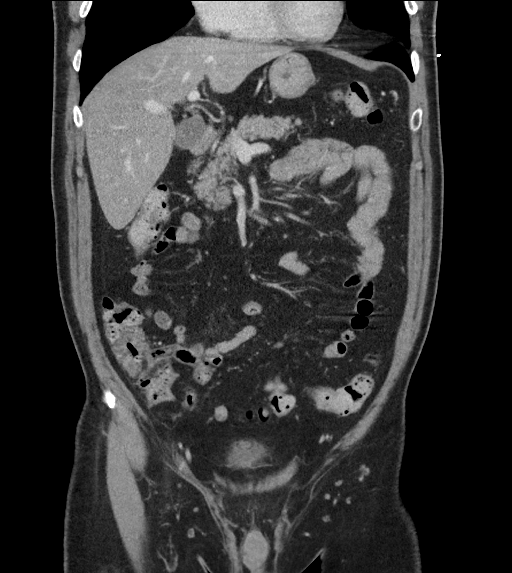
[im 38/85  soft-tissue]
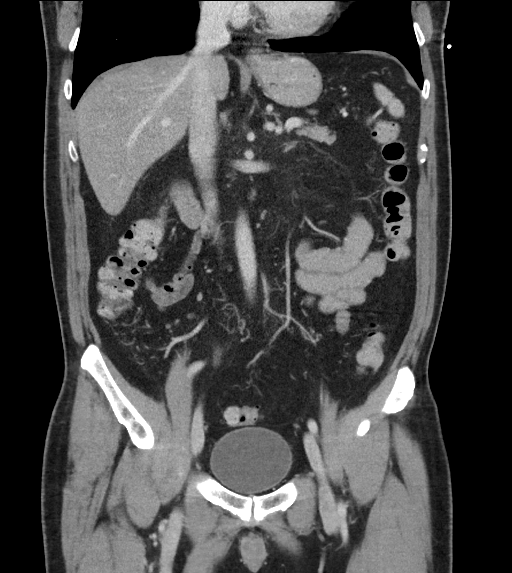
[im 47/85  soft-tissue]
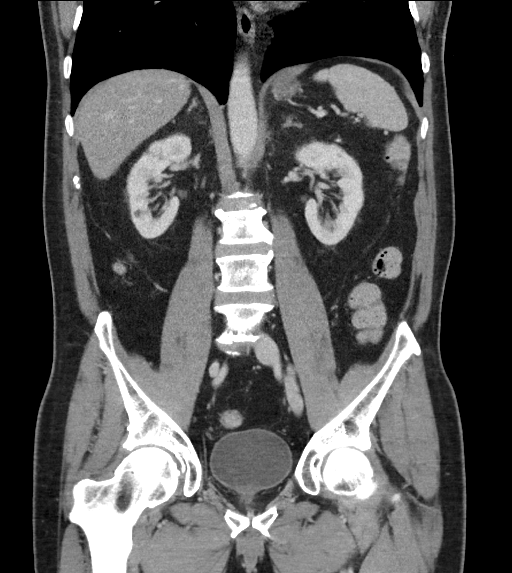

[16 of 46 positions shown; findings below may reference images not displayed]

FINDINGS: Lower chest: No acute abnormality.

Hepatobiliary: No focal liver abnormality is seen. No gallstones,
gallbladder wall thickening, or biliary dilatation.

Pancreas: Unremarkable. No pancreatic ductal dilatation or
surrounding inflammatory changes.

Spleen: Normal in size without focal abnormality.

Adrenals/Urinary Tract: Multiple renal cysts measuring up to 8 mm in
left interpolar kidney and 14 mm in right kidney lower pole. Normal
adrenal glands. No hydronephrosis or urinary stone. Normal bladder.

Stomach/Bowel: Stomach is within normal limits. Appendix appears
normal. No evidence of bowel wall thickening, distention, or
inflammatory changes.

Vascular/Lymphatic: Aortic atherosclerosis. No enlarged abdominal or
pelvic lymph nodes.

Reproductive: Prostate enlargement with calcifications.

Other: No abdominal wall hernia or abnormality. No abdominopelvic
ascites.

Musculoskeletal: Lumbar spondylosis greatest at the L4-5 and L5-S1
levels with prominent facet arthropathy. Stable bone island within
the L5 vertebral body.
IMPRESSION: 1. No acute process identified.
2. Prostate enlargement, multiple renal cysts, aortic
atherosclerosis, lower lumbar spondylosis.

By: Adinson Alv M.D.

## 2020-07-31 IMAGING — DX DG CHEST 2V
2 series · 2 of 2 positions shown · non-contrast
Comparison: None.

CLINICAL DATA: 76-year-old male with nausea.

EXAM:
CHEST - 2 VIEW

[chest pa]
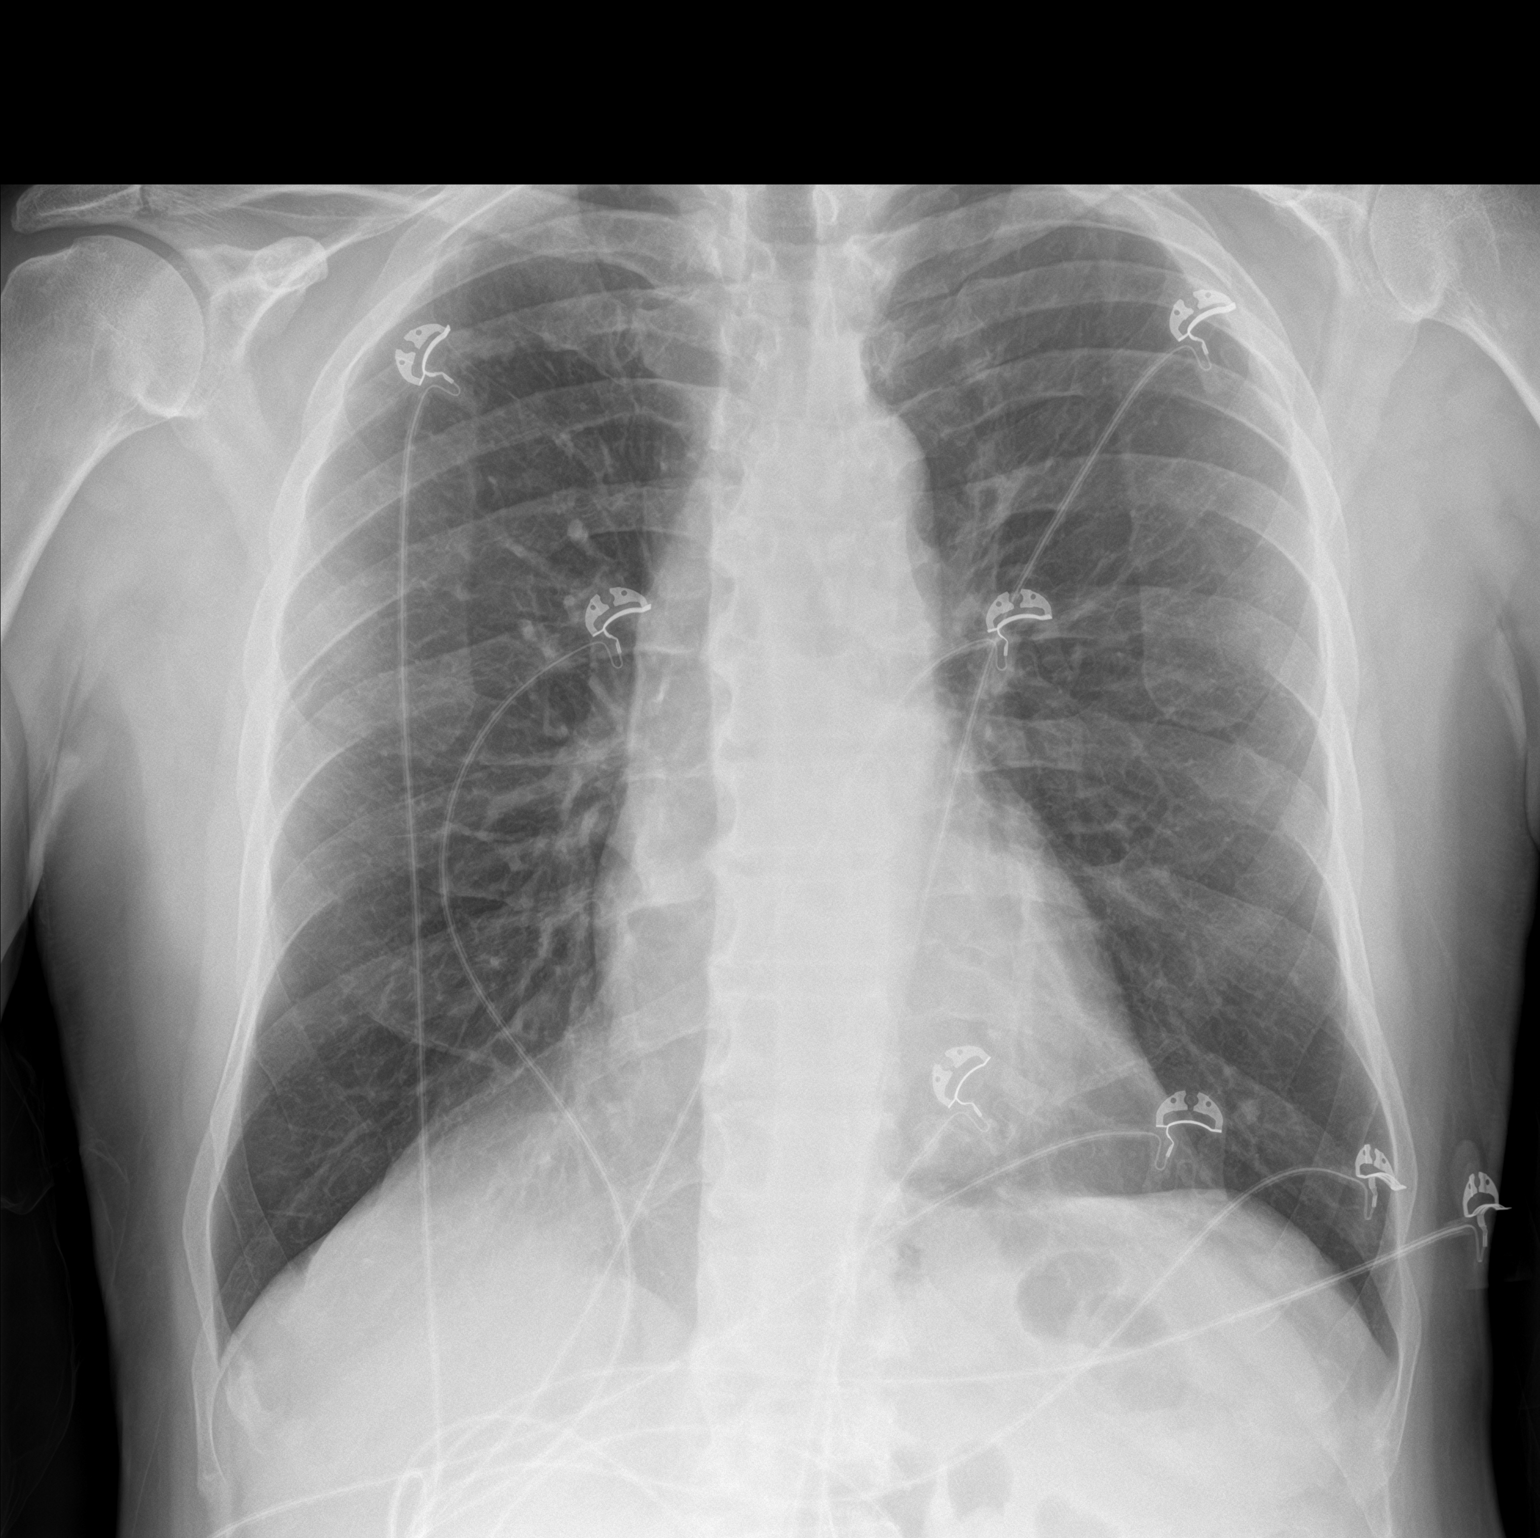

[chest lat]
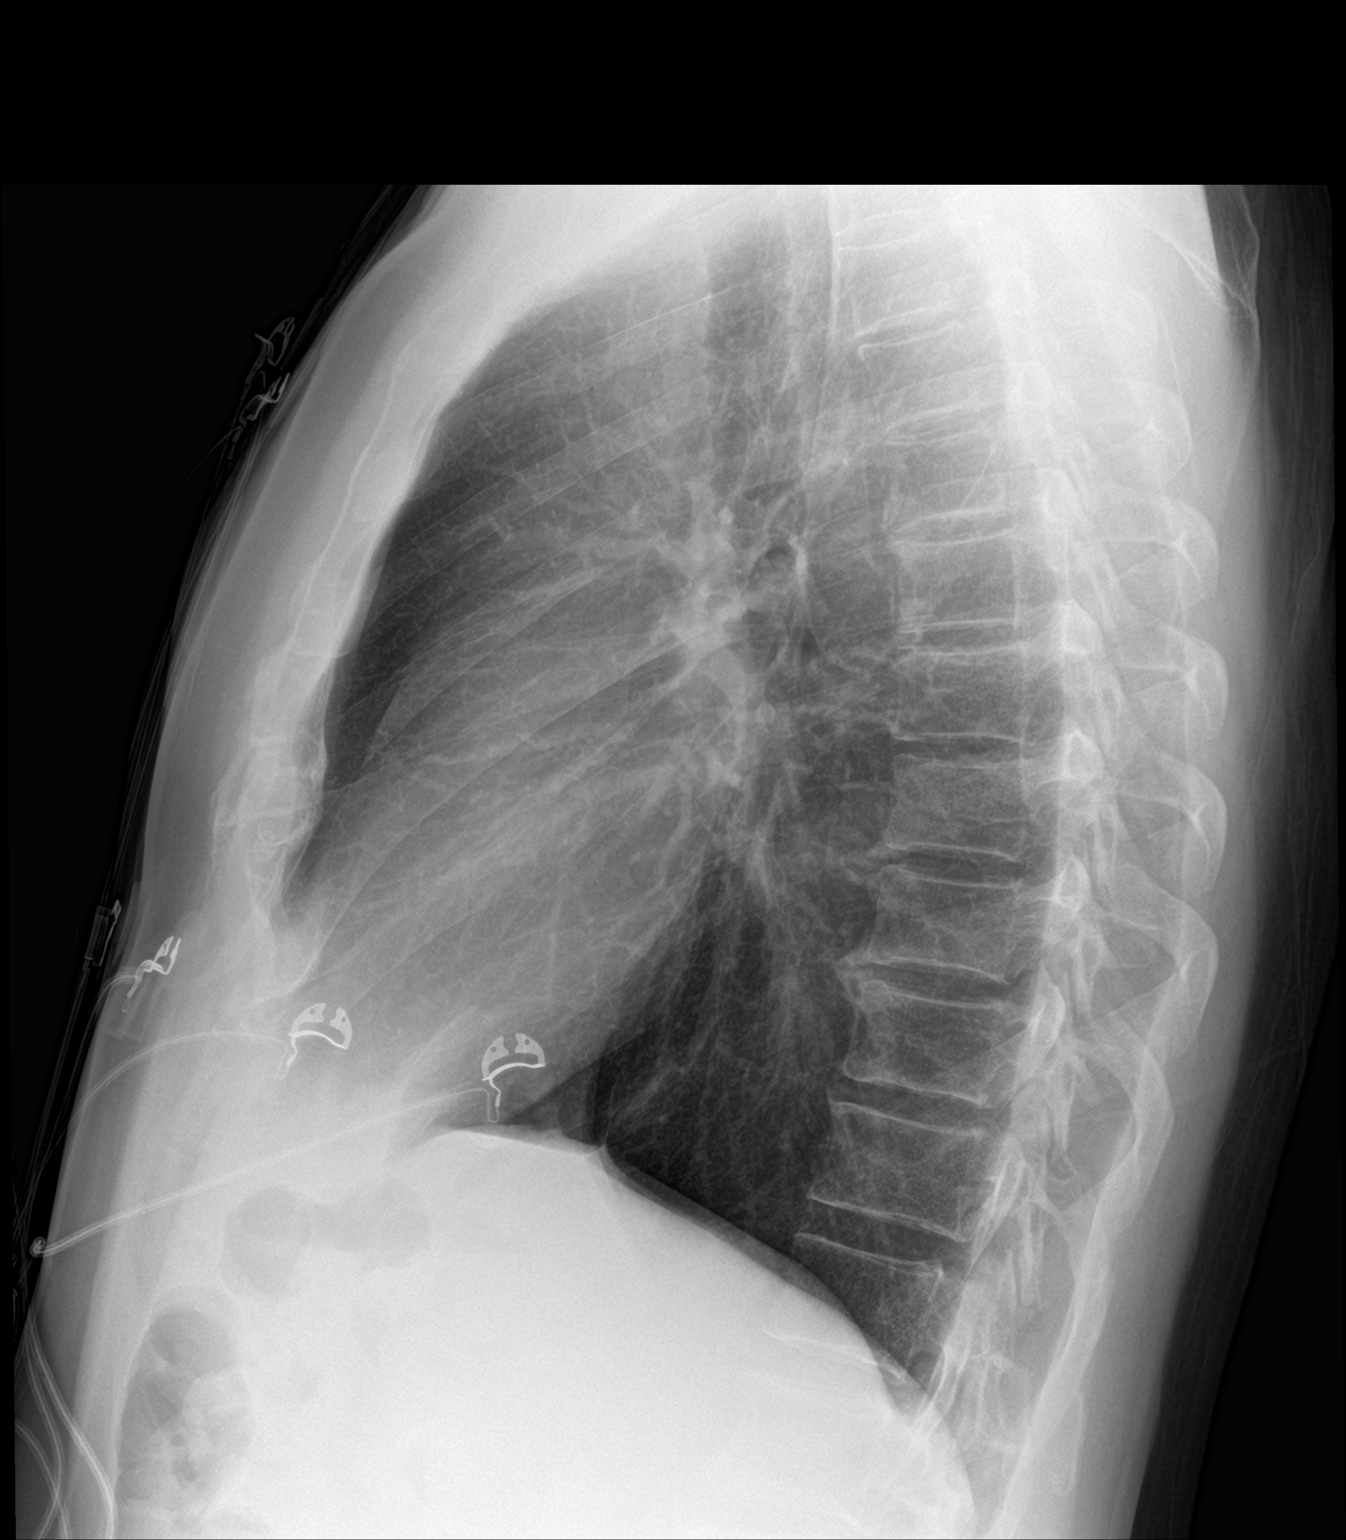

[2 of 2 positions shown; findings below may reference images not displayed]

FINDINGS: The heart size and mediastinal contours are within normal limits.
Both lungs are clear. The visualized skeletal structures are
unremarkable.
IMPRESSION: No active cardiopulmonary disease.

## 2021-07-16 ENCOUNTER — Encounter (HOSPITAL_COMMUNITY): Payer: Self-pay | Admitting: Emergency Medicine

## 2021-07-16 ENCOUNTER — Emergency Department (HOSPITAL_COMMUNITY)
Admission: EM | Admit: 2021-07-16 | Discharge: 2021-07-16 | Disposition: A | Discharge status: Home / Self Care | Payer: Medicare Other | Attending: Emergency Medicine | Admitting: Emergency Medicine

## 2021-07-16 ENCOUNTER — Other Ambulatory Visit: Payer: Self-pay

## 2021-07-16 DIAGNOSIS — Z7982 Long term (current) use of aspirin: Secondary | ICD-10-CM | POA: Insufficient documentation

## 2021-07-16 DIAGNOSIS — S0086XA Insect bite (nonvenomous) of other part of head, initial encounter: Secondary | ICD-10-CM | POA: Diagnosis present

## 2021-07-16 DIAGNOSIS — W57XXXA Bitten or stung by nonvenomous insect and other nonvenomous arthropods, initial encounter: Secondary | ICD-10-CM | POA: Diagnosis not present

## 2021-07-16 NOTE — ED Provider Notes (Signed)
?[facility] ?Provider Note ? ? ?CSN: [id number] ?Arrival date & time: 07/16/21  0744 ? ?  ? ?History ? ?Chief Complaint  ?Patient presents with  ? Insect Bite  ? ? ?Mario Brooks is a 81 y.o. male. ? ?HPI ? ?  ? ? ?Mario Brooks is a 81 y.o. male who presents to the Emergency Department complaining of itching and area of localized swelling to his left lower face.  He states that he was at an outdoor concert last evening and believes he was bitten by some type of insect.  He woke this morning with swelling and itching to the area.  He has applied prescription triamcinolone cream to the area along a topical Benadryl spray.  He reports swelling to the area has improved since arrival.  He denies any pain of his face or neck, shortness of breath, rash, redness, swelling of his lips or tongue. No difficulty swallowing ? ?Home Medications ?Prior to Admission medications   ?Medication Sig Start Date End Date Taking? Authorizing Provider  ?acetaminophen (TYLENOL) 500 MG tablet Take 500 mg by mouth as needed. For pain     [provider]  ?aspirin EC 81 MG tablet Take 81 mg by mouth at bedtime.      [provider]  ?atorvastatin (LIPITOR) 20 MG tablet Take 20 mg by mouth at bedtime.     [provider]  ?chlorpheniramine-HYDROcodone (TUSSIONEX) 10-8 MG/5ML LQCR Take 5 mLs by mouth every 12 (twelve) hours as needed. For cough     [provider]  ?co-enzyme Q-10 30 MG capsule Take 30 mg by mouth every morning.     [provider]  ?desloratadine (CLARINEX) 5 MG tablet Take 5 mg by mouth at bedtime.     [provider]  ?fish oil-omega-3 fatty acids 1000 MG capsule Take 1 g by mouth at bedtime.     [provider]  ?fluticasone (FLONASE) 50 MCG/ACT nasal spray Place 2 sprays into the nose daily.      [provider]  ?lisinopril (PRINIVIL,ZESTRIL) 5 MG tablet Take 5 mg by mouth at bedtime.     [provider]  ?Multiple  Vitamins-Minerals (MULTIVITAMINS THER. W/MINERALS) TABS Take 1 tablet by mouth 2 (two) times daily.      [provider]  ?Naphazoline-Pheniramine (VISINE-A OP) Apply 2 drops to eye daily as needed. For itchy, tired eye    [provider]  ?ondansetron (ZOFRAN ODT) 4 MG disintegrating tablet Take 1 tablet (4 mg total) by mouth every 8 (eight) hours as needed for nausea or vomiting. 10/31/17   Rancour, Annie Main, MD  ?OVER THE COUNTER MEDICATION Take 1 tablet by mouth 2 (two) times daily. MDR: Multivitamin     [provider]  ?tretinoin (RETIN-A) 0.025 % cream Apply 1 application topically daily as needed. For rosacea and blemishes    [provider]  ?vitamin E (VITAMIN E) 400 UNIT capsule Take 400 Units by mouth daily.      [provider]  ?   ? ?Allergies    ?Codeine   ? ?Review of Systems   ?Review of Systems  ?Constitutional:  Negative for chills and fever.  ?HENT:  Positive for facial swelling (swelling left lower face). Negative for congestion, sore throat and trouble swallowing.   ?Eyes:  Negative for visual disturbance.  ?Respiratory:  Negative for chest tightness and shortness of breath.   ?Cardiovascular:  Negative for chest pain.  ?Gastrointestinal:  Negative for  abdominal distention, nausea and vomiting.  ?Skin:  Negative for color change.  ?     Insect bite left face  ?Neurological:  Negative for dizziness, syncope, weakness, numbness and headaches.  ? ?Physical Exam ?Updated Vital Signs ?BP (!) 169/97 (BP Location: Right Arm)   Pulse 81   Temp 98.4 ?F (36.9 ?C) (Oral)   Resp 18   Ht 5\' 11"  (1.803 m)   Wt 86.2 kg   SpO2 95%   BMI 26.50 kg/m?  ?Physical Exam ?Vitals and nursing note reviewed.  ?Constitutional:   ?   General: He is not in acute distress. ?   Appearance: Normal appearance. He is not ill-appearing or toxic-appearing.  ?HENT:  ?   Head:  ?   Comments: 4 cm area of localized edema to the left lower face.  No erythema or excessive warmth  noted.  There is a small area that appears to be consistent with insect bite.  No fluctuance or drainage noted.  No soft tissue swelling of the neck. ?   Mouth/Throat:  ?   Mouth: Mucous membranes are moist. No oral lesions or angioedema.  ?   Tongue: No lesions.  ?   Pharynx: Oropharynx is clear. Uvula midline. No oropharyngeal exudate, posterior oropharyngeal erythema or uvula swelling.  ?Eyes:  ?   Conjunctiva/sclera: Conjunctivae normal.  ?   Pupils: Pupils are equal, round, and reactive to light.  ?Cardiovascular:  ?   Rate and Rhythm: Normal rate and regular rhythm.  ?   Pulses: Normal pulses.  ?Pulmonary:  ?   Effort: Pulmonary effort is normal. No respiratory distress.  ?   Breath sounds: Normal breath sounds.  ?Musculoskeletal:  ?   Cervical back: Normal range of motion. No rigidity or tenderness.  ?Lymphadenopathy:  ?   Cervical: No cervical adenopathy.  ?Neurological:  ?   Mental Status: He is alert.  ? ? ?ED Results / Procedures / Treatments   ?Labs ?(all labs ordered are listed, but only abnormal results are displayed) ?Labs Reviewed - No data to display ? ?EKG ?None ? ?Radiology ?No results found. ? ?Procedures ?Procedures  ? ? ?Medications Ordered in ED ?Medications - No data to display ? ?ED Course/ Medical Decision Making/ A&P ?  ?                        ?Medical Decision Making ? ?Patient here for evaluation of insect bite that occurred last evening. ?He woke this morning with localized itching and swelling of the left lower face.  There does appear to be a small area consistent with an insect bite.  No concerning symptoms to suggest abscess.  No angioedema noted.  Airway is patent.  Vital signs reassuring.  He is mildly hypertensive but has not taken his antihypertensive medication this morning. ? ?He has steroid cream at home, he has some localized itching of his face but no systemic symptoms.  Feel he is appropriate for discharge home.  He will continue to use his steroid cream and I have  recommended Benadryl and cool compresses as well. ? ? ? ? ? ? ? ?Final Clinical Impression(s) / ED Diagnoses ?Final diagnoses:  ?Insect bite of face, initial encounter  ? ? ?Rx / DC Orders ?ED Discharge Orders   ? ? None  ? ?  ? ? ?  ?Kem Parkinson, PA-C ?07/16/21 1039 ? ?  ?Fredia Sorrow, MD ?07/18/21 [phone] ? ?

## 2021-07-16 NOTE — ED Notes (Signed)
ED Provider at bedside. 

## 2021-07-16 NOTE — Discharge Instructions (Signed)
As discussed, continue to use your triamcinolone cream as directed.  Also, I recommend that you take an over-the-counter Benadryl capsule, 1 capsule every 4-6 hours as needed for itching.  This will likely cause drowsiness.  Do not drive or operate machinery while taking the Benadryl.  You may also apply cool compresses on and off to help reduce swelling.  Return to the emergency department for any new or worsening symptoms. ?

## 2021-07-16 NOTE — ED Notes (Signed)
Pt and wife given discharge instructions using teach back method. Medications and instructions on their use reviewed. RN had pt sign for discharge, but upon looking at screen after he had left, the signature pad was not working. Pt left ambulatory with a steady gait with his wife bedside him.  ?

## 2021-07-16 NOTE — ED Triage Notes (Signed)
Pt to the ED with complaints of an insect bite to his left cheek with swelling noted. Pt denies trouble breathing or swallowing. ? ?

## 2023-11-02 ENCOUNTER — Encounter: Payer: Self-pay | Admitting: Internal Medicine

## 2023-11-02 ENCOUNTER — Ambulatory Visit: Attending: Internal Medicine | Admitting: Internal Medicine

## 2023-11-02 VITALS — BP 152/83 | HR 75 | Ht 70.0 in | Wt 195.0 lb

## 2023-11-02 DIAGNOSIS — E785 Hyperlipidemia, unspecified: Secondary | ICD-10-CM | POA: Insufficient documentation

## 2023-11-02 DIAGNOSIS — E7849 Other hyperlipidemia: Secondary | ICD-10-CM

## 2023-11-02 DIAGNOSIS — I10 Essential (primary) hypertension: Secondary | ICD-10-CM | POA: Diagnosis not present

## 2023-11-02 DIAGNOSIS — Z7902 Long term (current) use of antithrombotics/antiplatelets: Secondary | ICD-10-CM | POA: Diagnosis not present

## 2023-11-02 MED ORDER — EZETIMIBE 10 MG PO TABS: 10.0000 mg | ORAL_TABLET | Freq: Every day | ORAL | 3 refills | Status: AC

## 2023-11-02 NOTE — Progress Notes (Signed)
 Cardiology Office Note  Date: 11/02/2023   ID: Mario, Brooks [birth year], MRN [id number]  PCP:  Mario Norleen Lenis, MD  Cardiologist:  None Electrophysiologist:  None   History of Present Illness: Mario Brooks is a 83 y.o. male  Here to establish care.  Transfer from [place].   Patient has single-vessel CAD involving significant stenosis in the proximal LAD and no other disease in the LCx or RCA.  At that time, bare-metal stent was placed in the LAD with an excellent angiographic result and no subsequent issues after the procedure.  He did not have any recurrent PCR LHC since 2010.  He denies having any angina or DOE.  No palpitations, dizziness, syncope, leg swelling.   Past Medical History:  Diagnosis Date   Coronary artery disease    GERD (gastroesophageal reflux disease)    Heart murmur    High cholesterol    Hypertension    Kidney calculi    Kidney stones     Past Surgical History:  Procedure Laterality Date   CATARACT EXTRACTION     right eye-dUKE   CORONARY ANGIOPLASTY WITH STENT PLACEMENT     CYSTOSCOPY  11/15/2010   Procedure: CYSTOSCOPY;  Surgeon: Mario Brooks;  Location: [facility];  Service: Urology;  Laterality: N/A;  Cysto/Holmium Laser Lithotripsy Bladder Calculus-specimen given to patient per MD   LITHOTRIPSY     1984   URETEROLITHOTOMY     OPEN REMOVAL OF KIDNEY STONE RIGHT KIDNEY    Current Outpatient Medications  Medication Sig Dispense Refill   acetaminophen  (TYLENOL ) 500 MG tablet Take 500 mg by mouth as needed. For pain      aspirin  EC 81 MG tablet Take 81 mg by mouth at bedtime.       atorvastatin (LIPITOR) 40 MG tablet Take 40 mg by mouth daily.     fluticasone  (FLONASE ) 50 MCG/ACT nasal spray Place 2 sprays into the nose daily.       PROTONIX  40 MG tablet      zolpidem  (AMBIEN ) 10 MG tablet TK 1 T PO  QHS PRN     Betaine, Trimethylglycine, (TMG, TRIMETHYLGLYCINE, PO)      cetirizine (ZYRTEC) 10 MG tablet Take 10 mg by mouth.      chlorpheniramine-HYDROcodone  (TUSSIONEX) 10-8 MG/5ML LQCR Take 5 mLs by mouth every 12 (twelve) hours as needed. For cough      co-enzyme Q-10 30 MG capsule Take 30 mg by mouth every morning.      desloratadine (CLARINEX) 5 MG tablet Take 5 mg by mouth at bedtime.      fish oil-omega-3 fatty acids  1000 MG capsule Take 1 g by mouth at bedtime.      losartan (COZAAR) 50 MG tablet Take 50 mg by mouth daily.     montelukast (SINGULAIR) 10 MG tablet Take 10 mg by mouth daily.     Multiple Vitamin (MULTI-VITAMIN DAILY PO)      Multiple Vitamins-Minerals (MULTIVITAMINS THER. W/MINERALS) TABS Take 1 tablet by mouth 2 (two) times daily.       Naphazoline-Pheniramine (VISINE-A OP) Apply 2 drops to eye daily as needed. For itchy, tired eye     niacin (VITAMIN B3) 500 MG tablet Take 500 mg by mouth.     ondansetron  (ZOFRAN  ODT) 4 MG disintegrating tablet Take 1 tablet (4 mg total) by mouth every 8 (eight) hours as needed for nausea or vomiting. 20 tablet 0   OVER THE COUNTER MEDICATION Take 1 tablet by mouth 2 (  two) times daily. MDR: Multivitamin      tretinoin  (RETIN-A ) 0.025 % cream Apply 1 application topically daily as needed. For rosacea and blemishes     vitamin E  (VITAMIN E ) 400 UNIT capsule Take 400 Units by mouth daily.       No current facility-administered medications for this visit.   Allergies:  Lisinopril    Social History: The patient  reports that he has never smoked. He has never used smokeless tobacco. He reports that he does not drink alcohol and does not use drugs.   Family History: The patient's family history is not on file.   ROS:  Please see the history of present illness. Otherwise, complete review of systems is positive for none.  All other systems are reviewed and negative.   Physical Exam: VS:  BP (!) 152/83 (BP Location: Left Arm, Cuff Size: Large)   Pulse 75   Ht 5' 10 (1.778 m)   Wt 195 lb (88.5 kg)   SpO2 98%   BMI 27.98 kg/m , BMI Body mass index is 27.98  kg/m.  Wt Readings from Last 3 Encounters:  11/02/23 195 lb (88.5 kg)  07/16/21 190 lb (86.2 kg)  12/01/10 181 lb 3.5 oz (82.2 kg)    General: Patient appears comfortable at rest. HEENT: Conjunctiva and lids normal, oropharynx clear with moist mucosa. Neck: Supple, no elevated JVP or carotid bruits, no thyromegaly. Lungs: Clear to auscultation, nonlabored breathing at rest. Cardiac: Regular rate and rhythm, no S3 or significant systolic murmur, no pericardial rub. Abdomen: Soft, nontender, no hepatomegaly, bowel sounds present, no guarding or rebound. Extremities: No pitting edema, distal pulses 2+. Skin: Warm and dry. Musculoskeletal: No kyphosis. Neuropsychiatric: Alert and oriented x3, affect grossly appropriate.  Recent Labwork: No results found for requested labs within last 365 days.  No results found for: CHOL, TRIG, HDL, CHOLHDL, VLDL, LDLCALC, LDLDIRECT   Assessment and Plan:  CAD s/p proximal LAD BMS in 2010 - No recurrent LHC or PCI since 2010 - Asymptomatic, no angina or DOE. - No indication for echocardiogram.  No loud murmur. - Continue cardioprotective medications, aspirin  81 mg once daily and atorvastatin 40 mg nightly.  Start Zetia  10 mg once daily, plan below. - ER precautions for chest pain provided. - Discussed symptoms of CAD and MI. - Exercise and heart healthy diet  HLD, not at goal - I reviewed the lipid panel that patient brought to the clinic today.  TG 171, LDL 93.  Goal LDL will need to be less than 70.  Currently on atorvastatin 40 mg nightly (increased from 20 mg nightly couple of years ago).  Start Zetia  10 mg once daily.  He likes to eat sweets.  Encouraged to have this after meals and not on empty stomach.  HTN, controlled - I reviewed the home BP log, blood pressure is controlled, less than 150 mmHg SBP. - Continue current antihypertensives, losartan 50 mg once daily.   I spent 45 minutes reviewing the prior medical records,  more than 3 labs, reports, discussion of CAD, HTN, HLD with the patient and documentation.  Medication Adjustments/Labs and Tests Ordered: Current medicines are reviewed at length with the patient today.  Concerns regarding medicines are outlined above.    Disposition:  Follow up 1 year  Signed, Danicia Terhaar Arleta Maywood, MD, 11/02/2023 9:05 AM    [facility] at [facility] [address], [place], [place] [address]

## 2023-11-02 NOTE — Patient Instructions (Signed)
 Medication Instructions:  Your physician has recommended you make the following change in your medication:   Start Zetia  10 mg daily   *If you need a refill on your cardiac medications before your next appointment, please call your pharmacy*  Lab Work: NONE   If you have labs (blood work) drawn today and your tests are completely normal, you will receive your results only by: MyChart Message (if you have MyChart) OR A paper copy in the mail If you have any lab test that is abnormal or we need to change your treatment, we will call you to review the results.  Testing/Procedures: NONE   Follow-Up: At [facility], you and your health needs are our priority.  As part of our continuing mission to provide you with exceptional heart care, our providers are all part of one team.  This team includes your primary Cardiologist (physician) and Advanced Practice Providers or APPs (Physician Assistants and Nurse Practitioners) who all work together to provide you with the care you need, when you need it.  Your next appointment:   1 year(s)  Provider:   You may see Vishnu P Mallipeddi, MD or one of the following Advanced Practice Providers on your designated Care Team:   [place], PA-C  Scotesia Burdette, [place] Olivia Pavy, [place]     We recommend signing up for the patient portal called MyChart.  Sign up information is provided on this After Visit Summary.  MyChart is used to connect with patients for Virtual Visits (Telemedicine).  Patients are able to view lab/test results, encounter notes, upcoming appointments, etc.  Non-urgent messages can be sent to your provider as well.   To learn more about what you can do with MyChart, go to [email or web address].   Other Instructions Thank you for choosing [facility]!

## 2023-11-15 ENCOUNTER — Encounter: Payer: Self-pay | Admitting: Internal Medicine

## 2023-11-15 ENCOUNTER — Telehealth: Payer: Self-pay | Admitting: *Deleted

## 2023-11-15 MED ORDER — ATORVASTATIN CALCIUM 40 MG PO TABS: 40.0000 mg | ORAL_TABLET | Freq: Every day | ORAL | 3 refills | Status: AC

## 2023-11-15 NOTE — Telephone Encounter (Signed)
Refill compete

## 2023-12-05 ENCOUNTER — Encounter: Payer: Self-pay | Admitting: Internal Medicine

## 2023-12-06 ENCOUNTER — Other Ambulatory Visit: Payer: Self-pay

## 2023-12-06 MED ORDER — LOSARTAN POTASSIUM 50 MG PO TABS: 50.0000 mg | ORAL_TABLET | Freq: Every day | ORAL | 3 refills | Status: AC

## 2024-02-24 ENCOUNTER — Encounter (HOSPITAL_COMMUNITY): Payer: Self-pay

## 2024-02-24 ENCOUNTER — Other Ambulatory Visit: Payer: Self-pay

## 2024-02-24 ENCOUNTER — Emergency Department (HOSPITAL_COMMUNITY)
Admission: EM | Admit: 2024-02-24 | Discharge: 2024-02-24 | Disposition: A | Discharge status: Home / Self Care | Attending: Emergency Medicine | Admitting: Emergency Medicine

## 2024-02-24 DIAGNOSIS — R040 Epistaxis: Secondary | ICD-10-CM | POA: Diagnosis present

## 2024-02-24 DIAGNOSIS — I2510 Atherosclerotic heart disease of native coronary artery without angina pectoris: Secondary | ICD-10-CM | POA: Insufficient documentation

## 2024-02-24 DIAGNOSIS — I10 Essential (primary) hypertension: Secondary | ICD-10-CM | POA: Diagnosis not present

## 2024-02-24 DIAGNOSIS — Z79899 Other long term (current) drug therapy: Secondary | ICD-10-CM | POA: Insufficient documentation

## 2024-02-24 DIAGNOSIS — Z7982 Long term (current) use of aspirin: Secondary | ICD-10-CM | POA: Insufficient documentation

## 2024-02-24 MED ORDER — OXYMETAZOLINE HCL 0.05 % NA SOLN
1.0000 | Freq: Once | NASAL | Status: AC
  Administered 2024-02-24: 1 via NASAL
  Filled 2024-02-24: qty 30

## 2024-02-24 NOTE — Discharge Instructions (Signed)
 You are seen the ER today for a nosebleed, this has stopped, if it starts again you can use 2 sprays of the Afrin and apply pressure for 15 minutes, if it does not stop or if you develop dizziness or other worsening symptoms come back to the ER right away.  Runny humidifier, use saline nasal spray to help keep your nasal passages moist, avoid picking or scratching your nose which can precipitate bleeding again.

## 2024-02-24 NOTE — ED Provider Notes (Signed)
 " [facility] DEPARTMENT AT [facility] Provider Note   CSN: [id number] Arrival date & time: 02/24/24  1659     Patient presents with: Epistaxis   Mario Brooks is a 83 y.o. male.  He has history of CAD, hypertension, high cholesterol, kidney stones and GERD.  Presents ER today for bleeding from his left nostril.  The started about 2 hours ago.  He states he believes he did pick his nose prior to this starting.  He states he takes aspirin  daily but no other blood thinners at this time.  Denies any cough or congestion.  No other complaints.    Epistaxis      Prior to Admission medications  Medication Sig Start Date End Date Taking? Authorizing Provider  acetaminophen  (TYLENOL ) 500 MG tablet Take 500 mg by mouth as needed. For pain     [provider]  aspirin  EC 81 MG tablet Take 81 mg by mouth at bedtime.      [provider]  atorvastatin  (LIPITOR) 40 MG tablet Take 1 tablet (40 mg total) by mouth daily. 11/15/23   Mallipeddi, Vishnu P, MD  Betaine, Trimethylglycine, (TMG, TRIMETHYLGLYCINE, PO)     [provider]  cetirizine (ZYRTEC) 10 MG tablet Take 10 mg by mouth.    [provider]  ezetimibe  (ZETIA ) 10 MG tablet Take 1 tablet (10 mg total) by mouth daily. 11/02/23 01/31/24  Mallipeddi, Vishnu P, MD  fish oil-omega-3 fatty acids  1000 MG capsule Take 1 g by mouth at bedtime.     [provider]  fluticasone  (FLONASE ) 50 MCG/ACT nasal spray Place 2 sprays into the nose daily.      [provider]  losartan  (COZAAR ) 50 MG tablet Take 1 tablet (50 mg total) by mouth daily. 12/06/23   Mallipeddi, Vishnu P, MD  montelukast (SINGULAIR) 10 MG tablet Take 10 mg by mouth daily.    [provider]  Multiple Vitamin (MULTI-VITAMIN DAILY PO)     [provider]  niacin (VITAMIN B3) 500 MG tablet Take 500 mg by mouth.    [provider]  PROTONIX  40 MG tablet  06/06/23   [provider]   zolpidem  (AMBIEN ) 10 MG tablet TK 1 T PO  QHS PRN 10/24/15   [provider]    Allergies: Lisinopril     Review of Systems  HENT:  Positive for nosebleeds.     Updated Vital Signs BP (!) 191/97 (BP Location: Right Arm)   Pulse (!) 105   Temp 97.9 F (36.6 C)   Resp 18   Wt 87.1 kg   SpO2 95%   BMI 27.55 kg/m   Physical Exam Vitals and nursing note reviewed.  Constitutional:      General: He is not in acute distress.    Appearance: He is well-developed.  HENT:     Head: Normocephalic and atraumatic.     Nose:     Comments: Minimal bleeding left naris on septum at area of Kiesselbach's plexus    Mouth/Throat:     Mouth: Mucous membranes are moist.  Eyes:     Extraocular Movements: Extraocular movements intact.     Conjunctiva/sclera: Conjunctivae normal.     Pupils: Pupils are equal, round, and reactive to light.  Cardiovascular:     Rate and Rhythm: Normal rate and regular rhythm.     Heart sounds: No murmur heard. Pulmonary:     Effort: Pulmonary effort is normal. No respiratory distress.  Breath sounds: Normal breath sounds.  Abdominal:     Palpations: Abdomen is soft.     Tenderness: There is no abdominal tenderness.  Musculoskeletal:        General: No swelling.     Cervical back: Neck supple.  Skin:    General: Skin is warm and dry.     Capillary Refill: Capillary refill takes less than 2 seconds.  Neurological:     General: No focal deficit present.     Mental Status: He is alert and oriented to person, place, and time.  Psychiatric:        Mood and Affect: Mood normal.     (all labs ordered are listed, but only abnormal results are displayed) Labs Reviewed - No data to display  EKG: None  Radiology: No results found.   Procedures   Medications Ordered in the ED  oxymetazoline  (AFRIN) 0.05 % nasal spray 1 spray (has no administration in time range)                                    Medical Decision Making Differential  diagnosis includes but is not limited to spontaneous epistaxis, traumatic epistaxis, anemia, colopathy, other  ED course: Patient presents for bleeding from his left nostril.  After applying pressure to the room this seems to be mostly stopped with some minimal oozing so we will apply Afrin.  Patient advised on running minified air, using nasal saline spray, avoidance of digital trauma, ENT follow-up and strict return precautions. Patient's bleeding has fully stopped with Afrin and pressure, there is no blood in his posterior pharynx, blood pressures improved to 168 systolic with heart rate of 75 on my evaluation.  Initial high readings likely due to anxiety and/or discomfort.   Risk OTC drugs.        Final diagnoses:  None    ED Discharge Orders     None          Suellen Sherran DELENA DEVONNA 02/24/24 1912  "

## 2024-02-24 NOTE — ED Notes (Signed)
 Pt/family received d/c paperwork at this time. After going over the paperwork any questions, comments, or concerns were answered to the best of this nurse's knowledge. The pt/family verbally acknowledged the teachings/instructions.

## 2024-02-24 NOTE — ED Triage Notes (Signed)
 Pt reports he takes Asprin and has bleeding from the left nare x 1 hour.  Pt instructed to pinch nose closed but he is not following directions well.
